# Patient Record
Sex: Female | Born: 1935 | Race: Black or African American | Hispanic: No | Smoking: Never smoker
Health system: Southern US, Community
[De-identification: ages and names within clinical notes are randomized; demographics above are authoritative.]

## PROBLEM LIST (undated history)

## (undated) DIAGNOSIS — M545 Low back pain, unspecified: Secondary | ICD-10-CM

## (undated) DIAGNOSIS — D649 Anemia, unspecified: Secondary | ICD-10-CM

## (undated) DIAGNOSIS — K5900 Constipation, unspecified: Secondary | ICD-10-CM

## (undated) DIAGNOSIS — K2100 Gastro-esophageal reflux disease with esophagitis, without bleeding: Secondary | ICD-10-CM

## (undated) DIAGNOSIS — K5730 Diverticulosis of large intestine without perforation or abscess without bleeding: Secondary | ICD-10-CM

## (undated) DIAGNOSIS — R634 Abnormal weight loss: Secondary | ICD-10-CM

## (undated) DIAGNOSIS — F0280 Dementia in other diseases classified elsewhere without behavioral disturbance: Secondary | ICD-10-CM

## (undated) DIAGNOSIS — K922 Gastrointestinal hemorrhage, unspecified: Secondary | ICD-10-CM

## (undated) DIAGNOSIS — M810 Age-related osteoporosis without current pathological fracture: Secondary | ICD-10-CM

## (undated) DIAGNOSIS — I10 Essential (primary) hypertension: Secondary | ICD-10-CM

## (undated) DIAGNOSIS — M1990 Unspecified osteoarthritis, unspecified site: Secondary | ICD-10-CM

## (undated) DIAGNOSIS — F419 Anxiety disorder, unspecified: Secondary | ICD-10-CM

## (undated) DIAGNOSIS — G309 Alzheimer's disease, unspecified: Secondary | ICD-10-CM

## (undated) HISTORY — DX: Abnormal weight loss: R63.4

## (undated) HISTORY — DX: Dementia in other diseases classified elsewhere, unspecified severity, without behavioral disturbance, psychotic disturbance, mood disturbance, and anxiety: F02.80

## (undated) HISTORY — DX: Anemia, unspecified: D64.9

## (undated) HISTORY — DX: Gastrointestinal hemorrhage, unspecified: K92.2

## (undated) HISTORY — DX: Diverticulosis of large intestine without perforation or abscess without bleeding: K57.30

## (undated) HISTORY — DX: Unspecified osteoarthritis, unspecified site: M19.90

## (undated) HISTORY — DX: Low back pain, unspecified: M54.50

## (undated) HISTORY — DX: Essential (primary) hypertension: I10

## (undated) HISTORY — DX: Anxiety disorder, unspecified: F41.9

## (undated) HISTORY — DX: Low back pain: M54.5

## (undated) HISTORY — DX: Alzheimer's disease, unspecified: G30.9

## (undated) HISTORY — DX: Age-related osteoporosis without current pathological fracture: M81.0

## (undated) HISTORY — DX: Constipation, unspecified: K59.00

## (undated) HISTORY — DX: Gastro-esophageal reflux disease with esophagitis: K21.0

## (undated) HISTORY — DX: Gastro-esophageal reflux disease with esophagitis, without bleeding: K21.00

---

## 2004-05-07 ENCOUNTER — Encounter: Admission: RE | Admit: 2004-05-07 | Discharge: 2004-05-07 | Payer: Self-pay | Admitting: Orthopedic Surgery

## 2005-06-28 ENCOUNTER — Inpatient Hospital Stay (HOSPITAL_COMMUNITY): Admission: EM | Admit: 2005-06-28 | Discharge: 2005-06-30 | Payer: Self-pay | Admitting: Emergency Medicine

## 2005-07-21 ENCOUNTER — Inpatient Hospital Stay (HOSPITAL_COMMUNITY): Admission: AD | Admit: 2005-07-21 | Discharge: 2005-07-29 | Payer: Self-pay | Admitting: Internal Medicine

## 2005-07-21 ENCOUNTER — Encounter: Payer: Self-pay | Admitting: Emergency Medicine

## 2005-07-27 ENCOUNTER — Encounter (INDEPENDENT_AMBULATORY_CARE_PROVIDER_SITE_OTHER): Payer: Self-pay | Admitting: Specialist

## 2006-01-26 ENCOUNTER — Ambulatory Visit: Payer: Self-pay | Admitting: Family Medicine

## 2006-03-02 ENCOUNTER — Ambulatory Visit: Payer: Self-pay | Admitting: Family Medicine

## 2006-03-07 ENCOUNTER — Emergency Department (HOSPITAL_COMMUNITY): Admission: EM | Admit: 2006-03-07 | Discharge: 2006-03-07 | Payer: Self-pay | Admitting: Emergency Medicine

## 2006-04-07 ENCOUNTER — Emergency Department (HOSPITAL_COMMUNITY): Admission: EM | Admit: 2006-04-07 | Discharge: 2006-04-07 | Payer: Self-pay | Admitting: Emergency Medicine

## 2006-04-10 ENCOUNTER — Ambulatory Visit: Payer: Self-pay | Admitting: Family Medicine

## 2006-04-13 ENCOUNTER — Ambulatory Visit: Payer: Self-pay | Admitting: Family Medicine

## 2006-05-12 ENCOUNTER — Emergency Department (HOSPITAL_COMMUNITY): Admission: EM | Admit: 2006-05-12 | Discharge: 2006-05-12 | Payer: Self-pay | Admitting: Emergency Medicine

## 2006-05-29 ENCOUNTER — Emergency Department (HOSPITAL_COMMUNITY): Admission: EM | Admit: 2006-05-29 | Discharge: 2006-05-29 | Payer: Self-pay | Admitting: Neonatology

## 2006-10-09 ENCOUNTER — Emergency Department (HOSPITAL_COMMUNITY): Admission: EM | Admit: 2006-10-09 | Discharge: 2006-10-09 | Payer: Self-pay | Admitting: Emergency Medicine

## 2008-01-08 ENCOUNTER — Ambulatory Visit (HOSPITAL_COMMUNITY): Admission: RE | Admit: 2008-01-08 | Discharge: 2008-01-08 | Payer: Self-pay | Admitting: Geriatric Medicine

## 2008-06-11 ENCOUNTER — Emergency Department (HOSPITAL_COMMUNITY): Admission: EM | Admit: 2008-06-11 | Discharge: 2008-06-11 | Payer: Self-pay | Admitting: Emergency Medicine

## 2008-12-02 ENCOUNTER — Ambulatory Visit: Payer: Self-pay | Admitting: Diagnostic Radiology

## 2008-12-02 ENCOUNTER — Emergency Department (HOSPITAL_BASED_OUTPATIENT_CLINIC_OR_DEPARTMENT_OTHER): Admission: EM | Admit: 2008-12-02 | Discharge: 2008-12-02 | Payer: Self-pay | Admitting: Emergency Medicine

## 2010-01-07 ENCOUNTER — Emergency Department (HOSPITAL_COMMUNITY): Admission: EM | Admit: 2010-01-07 | Discharge: 2010-01-07 | Payer: Self-pay | Admitting: Emergency Medicine

## 2010-12-12 ENCOUNTER — Encounter: Payer: Self-pay | Admitting: Orthopedic Surgery

## 2010-12-13 ENCOUNTER — Encounter: Payer: Self-pay | Admitting: Orthopedic Surgery

## 2010-12-19 ENCOUNTER — Inpatient Hospital Stay (HOSPITAL_COMMUNITY)
Admission: EM | Admit: 2010-12-19 | Discharge: 2010-12-29 | DRG: 378 | Disposition: A | Discharge status: Skilled Nursing Facility | Payer: Medicare Other | Attending: Internal Medicine | Admitting: Internal Medicine

## 2010-12-19 DIAGNOSIS — I472 Ventricular tachycardia, unspecified: Secondary | ICD-10-CM | POA: Diagnosis not present

## 2010-12-19 DIAGNOSIS — G309 Alzheimer's disease, unspecified: Secondary | ICD-10-CM | POA: Diagnosis present

## 2010-12-19 DIAGNOSIS — Z7982 Long term (current) use of aspirin: Secondary | ICD-10-CM

## 2010-12-19 DIAGNOSIS — Z66 Do not resuscitate: Secondary | ICD-10-CM | POA: Diagnosis not present

## 2010-12-19 DIAGNOSIS — K219 Gastro-esophageal reflux disease without esophagitis: Secondary | ICD-10-CM | POA: Diagnosis present

## 2010-12-19 DIAGNOSIS — D509 Iron deficiency anemia, unspecified: Secondary | ICD-10-CM | POA: Diagnosis present

## 2010-12-19 DIAGNOSIS — D62 Acute posthemorrhagic anemia: Secondary | ICD-10-CM | POA: Diagnosis present

## 2010-12-19 DIAGNOSIS — I4729 Other ventricular tachycardia: Secondary | ICD-10-CM | POA: Diagnosis not present

## 2010-12-19 DIAGNOSIS — K5731 Diverticulosis of large intestine without perforation or abscess with bleeding: Principal | ICD-10-CM | POA: Diagnosis present

## 2010-12-19 DIAGNOSIS — K5900 Constipation, unspecified: Secondary | ICD-10-CM | POA: Diagnosis present

## 2010-12-19 DIAGNOSIS — M810 Age-related osteoporosis without current pathological fracture: Secondary | ICD-10-CM | POA: Diagnosis present

## 2010-12-19 DIAGNOSIS — IMO0002 Reserved for concepts with insufficient information to code with codable children: Secondary | ICD-10-CM | POA: Diagnosis not present

## 2010-12-19 DIAGNOSIS — F0280 Dementia in other diseases classified elsewhere without behavioral disturbance: Secondary | ICD-10-CM | POA: Diagnosis present

## 2010-12-19 DIAGNOSIS — R55 Syncope and collapse: Secondary | ICD-10-CM | POA: Diagnosis not present

## 2010-12-19 DIAGNOSIS — F0390 Unspecified dementia without behavioral disturbance: Secondary | ICD-10-CM | POA: Diagnosis present

## 2010-12-19 DIAGNOSIS — Z79899 Other long term (current) drug therapy: Secondary | ICD-10-CM

## 2010-12-19 DIAGNOSIS — I10 Essential (primary) hypertension: Secondary | ICD-10-CM | POA: Diagnosis present

## 2010-12-19 DIAGNOSIS — C189 Malignant neoplasm of colon, unspecified: Secondary | ICD-10-CM | POA: Diagnosis present

## 2010-12-19 DIAGNOSIS — F411 Generalized anxiety disorder: Secondary | ICD-10-CM | POA: Diagnosis present

## 2010-12-19 DIAGNOSIS — F05 Delirium due to known physiological condition: Secondary | ICD-10-CM | POA: Diagnosis present

## 2010-12-19 LAB — POCT CARDIAC MARKERS
CKMB, poc: <1 ng/mL — ABNORMAL LOW (ref 1.0–8.0)
Myoglobin, poc: 66.3 ng/mL (ref 12–200)
Troponin i, poc: <0.05 ng/mL (ref 0.00–0.09)

## 2010-12-19 LAB — DIFFERENTIAL
Basophils Relative: 0 % (ref 0–1)
Eosinophils Absolute: 0 10*3/uL (ref 0.0–0.7)
Eosinophils Relative: 0 % (ref 0–5)
Lymphocytes Relative: 11 % — ABNORMAL LOW (ref 12–46)
Neutro Abs: 8.5 10*3/uL — ABNORMAL HIGH (ref 1.7–7.7)

## 2010-12-19 LAB — BASIC METABOLIC PANEL
BUN: 19 mg/dL (ref 6–23)
Calcium: 9.3 mg/dL (ref 8.4–10.5)
Creatinine, Ser: 0.81 mg/dL (ref 0.4–1.2)
GFR calc Af Amer: >60 mL/min (ref >60)
GFR calc non Af Amer: >60 mL/min (ref >60)

## 2010-12-19 LAB — CBC
HCT: 33.9 % — ABNORMAL LOW (ref 36.0–46.0)
MCH: 31.4 pg (ref 26.0–34.0)
MCV: 93.4 fL (ref 78.0–100.0)
Platelets: 215 10*3/uL (ref 150–400)
RBC: 3.63 MIL/uL — ABNORMAL LOW (ref 3.87–5.11)

## 2010-12-19 LAB — URINALYSIS, ROUTINE W REFLEX MICROSCOPIC
Bilirubin Urine: NEGATIVE
Nitrite: NEGATIVE
Protein, ur: NEGATIVE mg/dL
Specific Gravity, Urine: 1.022 (ref 1.005–1.030)
Urobilinogen, UA: 0.2 mg/dL (ref 0.0–1.0)

## 2010-12-19 LAB — HEMOGLOBIN AND HEMATOCRIT, BLOOD
HCT: 29.1 % — ABNORMAL LOW (ref 36.0–46.0)
Hemoglobin: 9.7 g/dL — ABNORMAL LOW (ref 12.0–15.0)

## 2010-12-19 LAB — OCCULT BLOOD, POC DEVICE: Fecal Occult Bld: POSITIVE

## 2010-12-19 LAB — URINE MICROSCOPIC-ADD ON

## 2010-12-20 LAB — GLUCOSE, CAPILLARY
Glucose-Capillary: 106 mg/dL — ABNORMAL HIGH (ref 70–99)
Glucose-Capillary: 128 mg/dL — ABNORMAL HIGH (ref 70–99)

## 2010-12-20 LAB — MRSA PCR SCREENING: MRSA by PCR: NEGATIVE

## 2010-12-20 LAB — HEMOGLOBIN AND HEMATOCRIT, BLOOD
HCT: 27.6 % — ABNORMAL LOW (ref 36.0–46.0)
Hemoglobin: 8.2 g/dL — ABNORMAL LOW (ref 12.0–15.0)

## 2010-12-21 LAB — GLUCOSE, CAPILLARY
Glucose-Capillary: 103 mg/dL — ABNORMAL HIGH (ref 70–99)
Glucose-Capillary: 118 mg/dL — ABNORMAL HIGH (ref 70–99)

## 2010-12-21 LAB — HEMOGLOBIN AND HEMATOCRIT, BLOOD
HCT: 24.2 % — ABNORMAL LOW (ref 36.0–46.0)
Hemoglobin: 8.3 g/dL — ABNORMAL LOW (ref 12.0–15.0)

## 2010-12-21 LAB — COMPREHENSIVE METABOLIC PANEL
ALT: 9 U/L (ref 0–35)
CO2: 27 mEq/L (ref 19–32)
Calcium: 8.4 mg/dL (ref 8.4–10.5)
Creatinine, Ser: 0.9 mg/dL (ref 0.4–1.2)
GFR calc non Af Amer: 59 mL/min — ABNORMAL LOW (ref >60)
Glucose, Bld: 111 mg/dL — ABNORMAL HIGH (ref 70–99)
Sodium: 142 mEq/L (ref 135–145)
Total Protein: 5.1 g/dL — ABNORMAL LOW (ref 6.0–8.3)

## 2010-12-21 LAB — CBC
Hemoglobin: 9.6 g/dL — ABNORMAL LOW (ref 12.0–15.0)
Platelets: 162 10*3/uL (ref 150–400)
RBC: 3.12 MIL/uL — ABNORMAL LOW (ref 3.87–5.11)
WBC: 11.1 10*3/uL — ABNORMAL HIGH (ref 4.0–10.5)

## 2010-12-22 HISTORY — PX: OTHER SURGICAL HISTORY: SHX169

## 2010-12-22 LAB — TYPE AND SCREEN
Antibody Screen: POSITIVE
Unit division: 0
Unit division: 0

## 2010-12-22 LAB — CBC
HCT: 30 % — ABNORMAL LOW (ref 36.0–46.0)
MCH: 31.7 pg (ref 26.0–34.0)
MCHC: 35 g/dL (ref 30.0–36.0)
MCV: 90.6 fL (ref 78.0–100.0)
Platelets: 136 10*3/uL — ABNORMAL LOW (ref 150–400)
RDW: 14.6 % (ref 11.5–15.5)
WBC: 10 10*3/uL (ref 4.0–10.5)

## 2010-12-22 LAB — BASIC METABOLIC PANEL
BUN: 9 mg/dL (ref 6–23)
Creatinine, Ser: 0.87 mg/dL (ref 0.4–1.2)
GFR calc non Af Amer: >60 mL/min (ref >60)
Glucose, Bld: 98 mg/dL (ref 70–99)
Potassium: 3.1 mEq/L — ABNORMAL LOW (ref 3.5–5.1)

## 2010-12-22 LAB — HEMOGLOBIN AND HEMATOCRIT, BLOOD
HCT: 27.2 % — ABNORMAL LOW (ref 36.0–46.0)
Hemoglobin: 10.3 g/dL — ABNORMAL LOW (ref 12.0–15.0)

## 2010-12-23 ENCOUNTER — Inpatient Hospital Stay (HOSPITAL_COMMUNITY): Payer: Medicare Other

## 2010-12-23 DIAGNOSIS — I471 Supraventricular tachycardia: Secondary | ICD-10-CM

## 2010-12-23 LAB — BASIC METABOLIC PANEL
BUN: 4 mg/dL — ABNORMAL LOW (ref 6–23)
Chloride: 109 mEq/L (ref 96–112)
Creatinine, Ser: 0.87 mg/dL (ref 0.4–1.2)
GFR calc non Af Amer: >60 mL/min (ref >60)
Glucose, Bld: 117 mg/dL — ABNORMAL HIGH (ref 70–99)
Potassium: 2.9 mEq/L — ABNORMAL LOW (ref 3.5–5.1)

## 2010-12-23 LAB — HEMOGLOBIN AND HEMATOCRIT, BLOOD
HCT: 28.3 % — ABNORMAL LOW (ref 36.0–46.0)
Hemoglobin: 9.5 g/dL — ABNORMAL LOW (ref 12.0–15.0)
Hemoglobin: 9.5 g/dL — ABNORMAL LOW (ref 12.0–15.0)
Hemoglobin: 9.5 g/dL — ABNORMAL LOW (ref 12.0–15.0)

## 2010-12-24 ENCOUNTER — Inpatient Hospital Stay (HOSPITAL_COMMUNITY): Admit: 2010-12-24 | Payer: Medicare Other

## 2010-12-24 ENCOUNTER — Inpatient Hospital Stay (HOSPITAL_COMMUNITY): Payer: Medicare Other

## 2010-12-24 LAB — BASIC METABOLIC PANEL
BUN: 5 mg/dL — ABNORMAL LOW (ref 6–23)
Chloride: 107 mEq/L (ref 96–112)
Glucose, Bld: 97 mg/dL (ref 70–99)
Potassium: 3.9 mEq/L (ref 3.5–5.1)

## 2010-12-24 LAB — CBC
HCT: 27.6 % — ABNORMAL LOW (ref 36.0–46.0)
MCH: 31.9 pg (ref 26.0–34.0)
MCV: 93.6 fL (ref 78.0–100.0)
RBC: 2.95 MIL/uL — ABNORMAL LOW (ref 3.87–5.11)
WBC: 9.8 10*3/uL (ref 4.0–10.5)

## 2010-12-24 MED ORDER — IOHEXOL 300 MG/ML  SOLN
450.0000 mL | Freq: Once | INTRAMUSCULAR | Status: AC | PRN
  Administered 2010-12-24: 450 mL
  Filled 2010-12-24: qty 450

## 2010-12-25 ENCOUNTER — Inpatient Hospital Stay (HOSPITAL_COMMUNITY): Payer: Medicare Other

## 2010-12-25 LAB — CBC
HCT: 29 % — ABNORMAL LOW (ref 36.0–46.0)
MCH: 31.2 pg (ref 26.0–34.0)
MCV: 94.2 fL (ref 78.0–100.0)
Platelets: 243 10*3/uL (ref 150–400)
RBC: 3.08 MIL/uL — ABNORMAL LOW (ref 3.87–5.11)

## 2010-12-25 LAB — BASIC METABOLIC PANEL
BUN: 5 mg/dL — ABNORMAL LOW (ref 6–23)
CO2: 29 mEq/L (ref 19–32)
Chloride: 107 mEq/L (ref 96–112)
Creatinine, Ser: 1.02 mg/dL (ref 0.4–1.2)
Glucose, Bld: 93 mg/dL (ref 70–99)

## 2010-12-25 LAB — HEMOGLOBIN AND HEMATOCRIT, BLOOD: Hemoglobin: 9.6 g/dL — ABNORMAL LOW (ref 12.0–15.0)

## 2010-12-25 MED ORDER — IOHEXOL 300 MG/ML  SOLN
125.0000 mL | Freq: Once | INTRAMUSCULAR | Status: AC | PRN
  Administered 2010-12-25: 125 mL via INTRAVENOUS

## 2010-12-26 LAB — BASIC METABOLIC PANEL
BUN: 5 mg/dL — ABNORMAL LOW (ref 6–23)
CO2: 28 mEq/L (ref 19–32)
Calcium: 9.2 mg/dL (ref 8.4–10.5)
Chloride: 106 mEq/L (ref 96–112)
Creatinine, Ser: 1.06 mg/dL (ref 0.4–1.2)
Glucose, Bld: 86 mg/dL (ref 70–99)

## 2010-12-26 LAB — CBC
HCT: 30.4 % — ABNORMAL LOW (ref 36.0–46.0)
Hemoglobin: 10.2 g/dL — ABNORMAL LOW (ref 12.0–15.0)
MCH: 31.5 pg (ref 26.0–34.0)
MCHC: 33.6 g/dL (ref 30.0–36.0)
MCV: 93.8 fL (ref 78.0–100.0)
RBC: 3.24 MIL/uL — ABNORMAL LOW (ref 3.87–5.11)

## 2010-12-28 LAB — BASIC METABOLIC PANEL
CO2: 29 mEq/L (ref 19–32)
Calcium: 9 mg/dL (ref 8.4–10.5)
GFR calc Af Amer: 59 mL/min — ABNORMAL LOW (ref >60)
GFR calc non Af Amer: 49 mL/min — ABNORMAL LOW (ref >60)
Glucose, Bld: 95 mg/dL (ref 70–99)
Potassium: 3.9 mEq/L (ref 3.5–5.1)
Sodium: 143 mEq/L (ref 135–145)

## 2011-01-01 ENCOUNTER — Emergency Department (HOSPITAL_COMMUNITY): Payer: Medicare Other

## 2011-01-01 ENCOUNTER — Emergency Department: Admit: 2011-01-01 | Payer: Medicare Other

## 2011-01-01 ENCOUNTER — Inpatient Hospital Stay (HOSPITAL_COMMUNITY)
Admission: EM | Admit: 2011-01-01 | Discharge: 2011-01-04 | DRG: 482 | Disposition: A | Discharge status: Skilled Nursing Facility | Payer: Medicare Other | Attending: Orthopedic Surgery | Admitting: Orthopedic Surgery

## 2011-01-01 DIAGNOSIS — G309 Alzheimer's disease, unspecified: Secondary | ICD-10-CM | POA: Diagnosis present

## 2011-01-01 DIAGNOSIS — K219 Gastro-esophageal reflux disease without esophagitis: Secondary | ICD-10-CM | POA: Diagnosis present

## 2011-01-01 DIAGNOSIS — E119 Type 2 diabetes mellitus without complications: Secondary | ICD-10-CM | POA: Diagnosis present

## 2011-01-01 DIAGNOSIS — S72143A Displaced intertrochanteric fracture of unspecified femur, initial encounter for closed fracture: Principal | ICD-10-CM | POA: Diagnosis present

## 2011-01-01 DIAGNOSIS — Z96659 Presence of unspecified artificial knee joint: Secondary | ICD-10-CM

## 2011-01-01 DIAGNOSIS — W19XXXA Unspecified fall, initial encounter: Secondary | ICD-10-CM | POA: Diagnosis present

## 2011-01-01 DIAGNOSIS — F0280 Dementia in other diseases classified elsewhere without behavioral disturbance: Secondary | ICD-10-CM | POA: Diagnosis present

## 2011-01-01 DIAGNOSIS — I10 Essential (primary) hypertension: Secondary | ICD-10-CM | POA: Diagnosis present

## 2011-01-01 HISTORY — PX: OTHER SURGICAL HISTORY: SHX169

## 2011-01-01 LAB — URINALYSIS, ROUTINE W REFLEX MICROSCOPIC
Ketones, ur: 15 mg/dL — AB
Leukocytes, UA: NEGATIVE
Protein, ur: 30 mg/dL — AB
Urine Glucose, Fasting: NEGATIVE mg/dL
pH: 6 (ref 5.0–8.0)

## 2011-01-01 LAB — CBC
HCT: 31.6 % — ABNORMAL LOW (ref 36.0–46.0)
Hemoglobin: 10.4 g/dL — ABNORMAL LOW (ref 12.0–15.0)
MCV: 95.5 fL (ref 78.0–100.0)
RDW: 15.5 % (ref 11.5–15.5)
WBC: 16.6 10*3/uL — ABNORMAL HIGH (ref 4.0–10.5)

## 2011-01-01 LAB — COMPREHENSIVE METABOLIC PANEL
ALT: 14 U/L (ref 0–35)
AST: 19 U/L (ref 0–37)
Albumin: 2.9 g/dL — ABNORMAL LOW (ref 3.5–5.2)
Alkaline Phosphatase: 52 U/L (ref 39–117)
Chloride: 106 mEq/L (ref 96–112)
Potassium: 3.6 mEq/L (ref 3.5–5.1)
Sodium: 143 mEq/L (ref 135–145)
Total Bilirubin: 0.7 mg/dL (ref 0.3–1.2)
Total Protein: 6.2 g/dL (ref 6.0–8.3)

## 2011-01-01 LAB — URINE MICROSCOPIC-ADD ON

## 2011-01-01 LAB — DIFFERENTIAL
Basophils Absolute: 0 10*3/uL (ref 0.0–0.1)
Eosinophils Relative: 0 % (ref 0–5)
Lymphocytes Relative: 9 % — ABNORMAL LOW (ref 12–46)
Lymphs Abs: 1.4 10*3/uL (ref 0.7–4.0)
Monocytes Absolute: 1 10*3/uL (ref 0.1–1.0)
Neutro Abs: 14.1 10*3/uL — ABNORMAL HIGH (ref 1.7–7.7)

## 2011-01-01 LAB — TYPE AND SCREEN: Antibody Screen: NEGATIVE

## 2011-01-02 LAB — BASIC METABOLIC PANEL
BUN: 11 mg/dL (ref 6–23)
Calcium: 8.3 mg/dL — ABNORMAL LOW (ref 8.4–10.5)
GFR calc non Af Amer: >60 mL/min (ref >60)
Glucose, Bld: 117 mg/dL — ABNORMAL HIGH (ref 70–99)
Potassium: 3.7 mEq/L (ref 3.5–5.1)
Sodium: 140 mEq/L (ref 135–145)

## 2011-01-02 LAB — CBC
HCT: 25.7 % — ABNORMAL LOW (ref 36.0–46.0)
MCHC: 33.1 g/dL (ref 30.0–36.0)
MCV: 95.2 fL (ref 78.0–100.0)
Platelets: 230 10*3/uL (ref 150–400)
RDW: 15.1 % (ref 11.5–15.5)
WBC: 11.3 10*3/uL — ABNORMAL HIGH (ref 4.0–10.5)

## 2011-01-02 LAB — GLUCOSE, CAPILLARY
Glucose-Capillary: 113 mg/dL — ABNORMAL HIGH (ref 70–99)
Glucose-Capillary: 119 mg/dL — ABNORMAL HIGH (ref 70–99)

## 2011-01-02 LAB — URINE CULTURE

## 2011-01-03 DIAGNOSIS — R404 Transient alteration of awareness: Secondary | ICD-10-CM

## 2011-01-03 LAB — GLUCOSE, CAPILLARY: Glucose-Capillary: 115 mg/dL — ABNORMAL HIGH (ref 70–99)

## 2011-01-08 ENCOUNTER — Emergency Department (HOSPITAL_COMMUNITY)
Admission: EM | Admit: 2011-01-08 | Discharge: 2011-01-08 | Disposition: A | Discharge status: Home / Self Care | Payer: Medicare Other | Attending: Emergency Medicine | Admitting: Emergency Medicine

## 2011-01-08 ENCOUNTER — Emergency Department (HOSPITAL_COMMUNITY): Payer: Medicare Other

## 2011-01-08 DIAGNOSIS — M25559 Pain in unspecified hip: Secondary | ICD-10-CM | POA: Insufficient documentation

## 2011-01-08 DIAGNOSIS — Z79899 Other long term (current) drug therapy: Secondary | ICD-10-CM | POA: Insufficient documentation

## 2011-01-08 DIAGNOSIS — G8918 Other acute postprocedural pain: Secondary | ICD-10-CM | POA: Insufficient documentation

## 2011-01-08 DIAGNOSIS — I10 Essential (primary) hypertension: Secondary | ICD-10-CM | POA: Insufficient documentation

## 2011-01-08 DIAGNOSIS — E119 Type 2 diabetes mellitus without complications: Secondary | ICD-10-CM | POA: Insufficient documentation

## 2011-01-08 DIAGNOSIS — K219 Gastro-esophageal reflux disease without esophagitis: Secondary | ICD-10-CM | POA: Insufficient documentation

## 2011-01-08 DIAGNOSIS — G309 Alzheimer's disease, unspecified: Secondary | ICD-10-CM | POA: Insufficient documentation

## 2011-01-08 DIAGNOSIS — F0280 Dementia in other diseases classified elsewhere without behavioral disturbance: Secondary | ICD-10-CM | POA: Insufficient documentation

## 2011-01-13 NOTE — H&P (Signed)
[name], [name]              ACCOUNT NO.:  [id number]  MEDICAL RECORD NO.:  [id number]          PATIENT TYPE:  INP  LOCATION:  0101                         FACILITY:  [facility]  PHYSICIAN:  Altha Harm, MDDATE OF BIRTH:  13-Dec-1918  DATE OF ADMISSION:  12/19/2010 DATE OF DISCHARGE:                             HISTORY & PHYSICAL   CHIEF COMPLAINT:  Bright red blood per rectum.  HISTORY OF PRESENT ILLNESS:  The patient is unable to give any history due to her dementia.  However, she can volunteer to me that she has some nausea with no vomiting.  According to the personnel from [facility], the patient was noted to have bright red blood per rectum and was sent over for evaluation.  They deny any history of fever, chills, vomiting, or diarrhea.  Here in the emergency, the patient was noted to have large bright red clots from her bottom and she has been afebrile here in the ER.  The patient is unable to give any more information because of her dementia and the nursing home was unable to supply any more details.  PAST MEDICAL HISTORY:  Significant for: 1. Dementia. 2. Hypertension. 3. Osteoporosis. 4. Constipation. 5. Iron deficiency anemia. 6. It looks like from the medications, gastroesophageal reflux disease     and possibly anxiety.  FAMILY HISTORY:  Again, I am unable to obtain because of her dementia.  SOCIAL HISTORY:  The patient lives in [facility] and it looks like she is in the assisted living.  However, I am unable to obtain any more social history.  Her current medications include the following: 1. Tylenol 650 mg p.o. daily. 2. Vasotec 2.5 mg p.o. daily. 3. Lorazepam 0.5 mg p.o. daily. 4. Fosamax 70 mg p.o. weekly. 5. Docusate 100 mg p.o. b.i.d. 6. MiraLax 17 g p.o. b.i.d. 7. Iron sulfate 325 mg p.o. daily. 8. Hydrocodone/APAP 5/325 mg p.o. b.i.d. 9. Aspirin enteric-coated 81 mg p.o. daily. 10.Exelon 1.5 mg p.o. b.i.d. 11.Prilosec 20 mg  p.o. q.h.s. 12.Calcium plus vitamin D 2 tablets p.o. b.i.d. 13.Absorbine liniment topically 1 application b.i.d. p.r.n. 14.Great shakes p.o. 1 bottle t.i.d..  ALLERGIES:  Per her records, her allergies are only to SULFA.  PRIMARY CARE PHYSICIAN:  Dr. Florentina Jenny.  REVIEW OF SYSTEMS:  The patient is unable to provide review of systems secondary to dementia.  The only thing she can tell me is that she has been having some nausea.  Please note that here in the emergency room, the patient was fully ambulatory by herself.  She attempted to get out of the bed and run back home.  Studies in the emergency room shows the following:  White blood cell count of 9.7, hemoglobin of 11.4, hematocrit of 33.9, platelet count of 215, and INR is 1.12.  Please note that her hemoglobin in February 2011 was 12.9.  Sodium 136, potassium 4.2, chloride 103, bicarbonate 27, BUN 19, and creatinine 0.81.  A 12-lead EKG shows normal sinus rhythm. Abdominal x-rays, which I will review once available.  In the emergency room, the urinalysis was negative for any elements consistent with urinary tract infection.  PHYSICAL EXAMINATION:  GENERAL:  The patient is lying in bed in no acute distress.  She is fully alert and awake.  She is oriented to herself only.  In general, this is a well-nourished patient, appears in no acute distress. VITAL SIGNS:  Her temperature is 98.6, heart rate 62, blood pressure 114/71, respiratory rate 12, and O2 saturations are 99% on room air. HEENT:  She is normocephalic and atraumatic.  Pupils are equal, round, and reactive to light.  The patient is unable to cooperate with the fundus examination.  However, there is no conjunctival pallor.  No icterus.  Oropharynx is moist.  Negative except erythema and lesions are noted.  The patient is completely edentulous and appears to have split over her mouth. NECK:  Trachea is midline.  No masses, no thyromegaly, no JVD, no carotid  bruits. RESPIRATORY:  She is a normal respiratory effort, equal excursion bilaterally.  No wheezing, no rhonchi noted. CARDIOVASCULAR:  She has got a normal S1 and S2.  No murmurs, rubs, or gallops are noted.  PMI is nondisplaced.  No heaves or thrills on palpation. ABDOMEN:  Soft.  She has got some right lower quadrant and suprapubic tenderness.  Otherwise, the abdomen is benign without any masses or any hepatosplenomegaly. LYMPH NODES:  She has no cervical, axillary, or inguinal lymphadenopathy noted. NEUROLOGICAL:  The patient is unable to cooperative with a formal neurological examination.  However, the patient demonstrates that she is able to move all extremities against gravity.  She is able to get out of the bed by herself and walk towards the door.  On inspection, she appears to have no asymmetry present.  DTRs are 2+ in the bilateral upper and lower extremities. PSYCHIATRIC:  As noted, the patient does have a long-standing dementia since 2006.  She is alert to herself.  However, her orientation to time and place is absent.  The patient does not have any recent recall, but she is able to tell me that she has 2 sons and the names of her sons, which were correct.  Certainly her insight and cognition are impaired.  ASSESSMENT AND PLAN:  This patient who presents with gastrointestinal bleed, which appears to be likely lowered, which is due to the bright red blood.  I suspect the patient may have some acute diverticulitis recurring or arteriovenous malformation.  I think that this is beyond the scope of hemorrhoids, given the amount of blood that was observed here in the emergency room.  Gastroenterology has been consulted with the patient and we will likely need to do an endoscopy.  We will monitor the patient's hemoglobin serially and replace her blood as necessary. The patient is being placed on clear liquids for now until she is seen by Gastroenterology.  All of her oral  medications will be held.  In terms of her hypertension present, the patient is normotensive and we will give her medications p.r.n. as needed for any escalations in her blood pressure.  The patient does have previous records of diabetes type 2 listed as part of history.  However, she has no medications that would address and no mention in records sent over this admission, diabetes type 2.  We will get a hemoglobin A1c to evaluate for the patient and check her blood sugars without coverage for now.  The patient is being admitted to non-telemetry bed as she is completely stable and we will check her vitals q.4 h.  For deep venous thrombosis prophylaxis, she will be on  SCDs and for gastrointestinal prophylaxis, Protonix will be added.     Altha Harm, MD     MAM/MEDQ  D:  12/19/2010  T:  12/19/2010  Job:  [id number]  cc:   Dr. Florentina Jenny.  Electronically Signed by Marthann Schiller MD on 01/13/2011 02:13:18 PM

## 2011-01-19 NOTE — Discharge Summary (Signed)
  [name], [name]              ACCOUNT NO.:  [id number]  MEDICAL RECORD NO.:  [id number]           PATIENT TYPE:  I  LOCATION:  5038                         FACILITY:  [facility]  PHYSICIAN:  Burnard Bunting, M.D.    DATE OF BIRTH:  03-28-1919  DATE OF ADMISSION:  01/01/2011 DATE OF DISCHARGE:  01/04/2011                              DISCHARGE SUMMARY   DISCHARGE DIAGNOSIS:  Left hip fracture.  SECONDARY DIAGNOSES: 1. Alzheimer's. 2. Diet-controlled diabetes. 3. History of diverticulosis status post GI bleed in January.  OPERATIONS AND PROCEDURES:  Left hip intertrochanteric fracture, open reduction internal fixation performed January 01, 2011.  HOSPITAL COURSE:  [name] is a 77 year old ambulatory female, resident of [facility] who sustained a fall on the day of admission.  She underwent operative fixation of the hip fracture.  Because of her recent GI bleed, she was maintained on aspirin and mechanical DVT prophylaxis.  She had an unremarkable hospital recovery.  Hemoglobin and laboratory values were stable at the time of discharge.  Her last hemoglobin was 8.5.  Vital signs were stable.  She is discharged in good condition.  Touchdown weightbearing left lower extremity.  Discharge medications include Depakote 250 b.i.d., Lopressor 25 mg p.o. b.i.d., ferrous sulfate 325 daily, lorazepam 0.5 mg tablets q.12 h., Fosamax 70 mg p.o. q.7 days, Colace 100 mg p.o. b.i.d., Exelon 1.5 mg p.o. b.i.d., Protonix 40 mg p.o. daily as well as Norco 5/325 one p.o. q.4 h. p.r.n. pain and aspirin 325 mg p.o. daily for 30 days.  She will follow up with me in 10 days for suture removal.  She will be touchdown weightbearing as tolerated, will likely advance her weightbearing status after her first office visit.  She is discharged in good condition.     Burnard Bunting, M.D.     GSD/MEDQ  D:  01/04/2011  T:  01/04/2011  Job:  [id number]  Electronically Signed by  Reece Agar.  DEAN M.D. on 01/19/2011 08:39:19 AM

## 2011-01-19 NOTE — H&P (Signed)
[name]              ACCOUNT NO.:  [id number]  MEDICAL RECORD NO.:  [id number]           PATIENT TYPE:  I  LOCATION:  5038                         FACILITY:  [facility]  PHYSICIAN:  Burnard Bunting, M.D.    DATE OF BIRTH:  1918-12-18  DATE OF ADMISSION:  01/01/2011 DATE OF DISCHARGE:                             HISTORY & PHYSICAL   CHIEF COMPLAINT:  Left hip pain.  HISTORY OF PRESENT ILLNESS:  [name] is an 77 year old ambulatory female, resident of [address] [facility], who fell earlier today without loss of consciousness.  She reports left hip pain.  She is unable to ambulate.  She denies any other orthopedic complaints, not really taking anything for the pain.  PAST MEDICAL HISTORY:  Notable for Alzheimer's; OA status post right TKA; diabetes, diet controlled; diverticulosis; status post recent hospitalization for GI bleed with subsequent sigmoidoscopy which demonstrated diverticulosis, she was discharged about 5 days ago; gastroesophageal reflux disease; hypertension.  The patient is a resident at [facility].  She ambulates unassisted there according to her son.  She does have a son in [place].  CURRENT MEDICATIONS:  Depakote 250 b.i.d., Lopressor 25 b.i.d., ferrous sulfate 325 daily, Lorazepam, Exelon, Fosamax.  ALLERGY STATUS:  SULFA.  Her old records from [facility] are reviewed.  The patient is a DNR.  PHYSICAL EXAMINATION:  GENERAL:  She is well developed, well nourished, in no acute distress, alert but not oriented. VITAL SIGNS:  Blood pressure 108/48, pulse 91, respirations 14. CHEST:  Clear to auscultation. CARDIAC:  Heart beats regular rate and rhythm. ABDOMEN:  Benign. EXTREMITIES:  Bilateral upper extremities have full range of motion of wrist, elbow, shoulder.  Palpable radial pulse bilaterally.  No real crepitus in the wrist, elbow, or shoulder region.  No swelling in these areas.  Right lower extremity has TKA scar and no pain,  grinding, or crepitus with ankle, hip, or knee range of motion.  There is no effusion in the right knee.  The DP pulse palpable bilaterally, 2+/4. Dorsiflexion and plantar flexion intact, 5+/5 bilaterally.  Left lower extremity is shortened.  There is no crepitus of the ankle range of motion and knee range of motion does have a lot of pain with hip range of motion.  LABORATORY VALUES:  Hemoglobin 10.9, white count 16, platelets 276. Sodium and potassium 143 and 3.6, BUN and creatinine 18 and 0.7 with glucose 109.  UA negative.  Chest x-ray, no airspace disease. EKG; normal sinus rhythm, right bundle-branch block. Left hip shows high intertrochanteric fracture.  IMPRESSION:  Left hip intertrochanteric fracture.  PLAN:  I called [name] who is the son of [name].  She actually is fairly active at [facility].  She is able to walk from her room to the meal location.  The risks and benefits of surgical and nonsurgical therapy are discussed with the patient and the patient's son.  At this time, because she is ambulatory, it is decided to proceed with fixation.  The risks and benefits, including but not limited to infection, nerve or vessel damage, failure of fixation, hospitalization, risk of deep  vein thrombosis, all discussed with the patient and the patient's son.  Likely that we may not be able to anticoagulate the patient because of a recent GI bleed.  We will likely go with aspirin and DVT and mechanical prophylaxis.  The risks and benefits are all discussed with [name] who agrees to proceed with surgery.  All questions were answered.     Burnard Bunting, M.D.     GSD/MEDQ  D:  01/01/2011  T:  01/01/2011  Job:  [id number]  Electronically Signed by Reece Agar.  DEAN M.D. on 01/19/2011 08:39:21 AM

## 2011-01-19 NOTE — Op Note (Signed)
  [name], [name]              ACCOUNT NO.:  [id number]  MEDICAL RECORD NO.:  [id number]           PATIENT TYPE:  I  LOCATION:  5038                         FACILITY:  [facility]  PHYSICIAN:  Burnard Bunting, M.D.    DATE OF BIRTH:  [birth year]  DATE OF PROCEDURE:  01/01/2011 DATE OF DISCHARGE:                              OPERATIVE REPORT   PREOPERATIVE DIAGNOSIS:  Left hip intertrochanteric fracture.  POSTOPERATIVE DIAGNOSIS:  Left hip intertrochanteric fracture.  PROCEDURE:  Left hip intertrochanteric fracture, open reduction and internal fixation with Smith and Nephew 10 x 38 intramedullary hip screw and a 90-mm lag screw.  SURGEON:  Burnard Bunting, MD.  ASSISTANT:  None.  ANESTHESIA:  General endotracheal.  ESTIMATED BLOOD LOSS:  50.  INDICATIONS:  [name] is an ambulatory 77 year old female with left hip fracture, who presents for operative management after explanation of risks and benefits.  PROCEDURE IN DETAIL:  The patient was brought to the operating room where general endotracheal anesthesia was induced.  Preoperative antibiotics were administered.  The patient was placed on the fracture table with left hip under traction in internal rotation.  A time-out was called.  Good reduction was then achieved with traction, confirmed in the AP and lateral planes under fluoroscopy.  The hip region was then prepped with alcohol and Betadine, which allowed to air dry, prepped with DuraPrep solution and draped in a sterile manner.  An incision was made about a handbreadth proximal to the tip of the trochanter.  Guide pin was placed through the tip of the trochanter, confirmed in the AP and lateral planes to be in the center portion of the proximal femur. Proximal reaming was performed in accordance with preoperative templating.  A 10 x 38 nail was placed.  Lag screw was then placed in the center inferior portion of the femoral head and neck with good purchase obtained.   Compression screw was then placed as well with the fracture released.  The fluoroscopy was used to confirm correct location of the hardware.  The patient tolerated the procedure well.  Instruments were removed.  Both incisions were irrigated and closed using 0 Vicryl suture, 2-0 Vicryl suture, and skin staples.  Mepilex dressing was applied.  The patient tolerated the procedure well without immediate complications.     Burnard Bunting, M.D.     GSD/MEDQ  D:  01/01/2011  T:  01/02/2011  Job:  [id number]  Electronically Signed by Reece Agar.  [name] M.D. on 01/19/2011 08:39:24 AM

## 2011-01-27 NOTE — Consult Note (Signed)
  [name], MOM              ACCOUNT NO.:  [id number]  MEDICAL RECORD NO.:  [id number]          PATIENT TYPE:  INP  LOCATION:  1228                         FACILITY:  [facility]  PHYSICIAN:  Nacole Fluhr C. Madilyn Fireman, M.D.    DATE OF BIRTH:  [birth year]  DATE OF CONSULTATION:  12/20/2010 DATE OF DISCHARGE:                                CONSULTATION   REASON FOR CONSULTATION:  Gastrointestinal bleed.  HISTORY OF PRESENT ILLNESS:  The patient is an 77 year old black female who presented from the nursing home with episodes of acute onset of hematochezia without any apparent pain and no hemodynamic instability. Her hemoglobin was 11.7 on admission, but she has had several more bloody stools since admission and her hemoglobin this morning is 8.4. She is unable to give any history but is fairly alert and denies any pain.  She had a flexible sigmoidoscopy and barium enema in 2006 which showed significant diverticulosis and inability to pass the scope beyond 30 cm with a strictured area seen on the barium enema.  In February 2011 she had a CT scan which showed significant sigmoid diverticulosis with some wall thickening.  As far as I can tell, she has not had any previous GI bleeding and there was no direct suspicion of neoplasm on the studies in 2006.  PAST MEDICAL HISTORY:  Dementia, hypertension, osteoporosis.  MEDICATIONS:  Tylenol, Vasotec, lorazepam, Fosamax, Docusate, MiraLax, Prilosec, Exelon.  ALLERGIES:  SULFA.  PHYSICAL EXAMINATION:  GENERAL:  The patient tries to converse, does not respond directly to specific questions. ABDOMEN:  Somewhat scaphoid, soft with normoactive bowel sounds.  No hepatosplenomegaly, mass or guarding.  IMPRESSION:  Lower gastrointestinal bleed.  Suspect diverticular with preexisting diverticular stricture.  Cannot conclusively rule out neoplasm.  PLAN:  Supportive care.  Transfuse as necessary.  Probably should have a sigmoidoscopy at some point this  admission.  We will hold off for now and see if her bleeding stops on its own.  We will follow with you.          ______________________________ Everardo All. Madilyn Fireman, M.D.     JCH/MEDQ  D:  12/20/2010  T:  12/20/2010  Job:  [id number]  Electronically Signed by Dorena Cookey M.D. on 01/25/2011 07:07:35 PM

## 2011-01-27 NOTE — Op Note (Signed)
  [name], [name]              ACCOUNT NO.:  [id number]  MEDICAL RECORD NO.:  [id number]          PATIENT TYPE:  INP  LOCATION:  1228                         FACILITY:  [facility]  PHYSICIAN:  Lindsi Bayliss C. Madilyn [name], M.D.    DATE OF BIRTH:  31-Oct-1919  DATE OF PROCEDURE:  12/22/2010 DATE OF DISCHARGE:                              OPERATIVE REPORT   PROCEDURE:  Attempted sigmoidoscopy  INDICATIONS FOR PROCEDURE:  Lower GI bleeding.  Some thickening of sigmoid colon on previous studies.  DESCRIPTION OF PROCEDURE:  The patient was placed in the left lateral decubitus position and placed on the pulse monitor with continuous low- flow oxygen delivered by nasal cannula.  She was sedated with 50 mcg IV fentanyl and 2 mg IV Versed.  The Olympus video colonoscope was inserted into the rectum and advanced to about 30 cm.  There was a lot of blood in the rectal vault which was suctioned away and no active bleeding was seen, although the blood did appear fairly fresh.  The scope was advanced fairly easy to about 30 cm where there was a sharp turn and what appeared to be a circumferential thickening of normal appearing mucosa with the lumen somewhere in the middle but I could not locate or advance the scope through it after many attempts.  This similar description was described when Dr. Randa Evens attempted a colonoscopy 5 years ago and was unable to traverse the same area.  Followup barium enema was successful and apparently just showed some mild stricture.  At any rate, I did not see any visible neoplasm.  No active bleeding involved with this lesion although it appeared the blood was coming from above, although the exact course of the lumen could not be followed through this area.  The scope was gradually withdrawn after I could not advance the scope through it.  The patient appeared to not hold air very well at this level and passed lot of flatus through the rectum as I tried to insufflate to  visualized better.  I did not specifically see any diverticula or any other mucosal abnormalities or definite active bleeding down to the rectum, although as mentioned there was fairly fresh blood seen a most levels.  The scope was then withdrawn and the patient returned to the recovery room in stable condition.  She tolerated the procedure well.  There were no immediate complications.  IMPRESSION: 1. Fresh blood in the rectum and sigmoid. 2. Possible extrinsic compression or edema of mucosal folds narrowing     lumen, unable to advance beyond 30 cm.  No neoplasm.  No active     bleeding source seen.  PLAN:  Will pursue.          ______________________________ [name], M.D.     JCH/MEDQ  D:  12/22/2010  T:  12/22/2010  Job:  [id number]  Electronically Signed by Dorena Cookey M.D. on 01/25/2011 07:07:40 PM

## 2011-01-28 NOTE — Consult Note (Signed)
NAMESHENEA, GIACOBBE              ACCOUNT NO.:  [id number]  MEDICAL RECORD NO.:  [id number]           PATIENT TYPE:  LOCATION:                                 FACILITY:  PHYSICIAN:  Pricilla Riffle, MD, FACCDATE OF BIRTH:  1919-02-20  DATE OF CONSULTATION:  12/23/2010 DATE OF DISCHARGE:                                CONSULTATION   IDENTIFICATION:  The patient is an 77 year old who we are asked to see regarding tachycardia.  HISTORY OF PRESENT ILLNESS:  The patient has no prior cardiac history by report other than hypertension.  She was admitted on December 19, 2010, with bright red blood per rectum, felt initially diverticular now being evaluated for colonic obstruction.  She had a near syncopal spell on December 19, 2010, which was felt secondary to bleeding.  The patient today has had two episodes of SVT.  Both broke quickly after IV Lopressor was given, one occurred at 3:25 a.m. to 3:54 a.m., second occurred at 1647 to 1747.  The patient was hemodynamically stable.  She denies complaints though she denies chest pain, though she is not the best historian.  ALLERGIES:  SULFA.  MEDICATIONS:  Medications here IV Protonix 40 mg b.i.d., IV fluids at 75 mL/hour, Haldol p.r.n., 4 units of packed red cells, Ativan, Exelon 1.5 p.o. b.i.d., Lasix p.r.n., diltiazem drip discontinued, KCl p.r.n., Lopressor 25 mg p.o. b.i.d., Seroquel 25 mg b.i.d., magnesium sulfate, Lopressor 5 mg q.6 h. p.r.n. IV.  PAST MEDICAL HISTORY: 1. GI bleed 2. Dementia. 3. Hypertension. 4. Osteoporosis. 5. Anemia. 6. GE reflux.  SOCIAL HISTORY:  The patient lives in [place] [facility].  FAMILY HISTORY:  Not obtainable.  REVIEW OF SYSTEMS:  Not obtainable.  The patient confused.  The patient is DNR.  PHYSICAL EXAMINATION:  GENERAL:  On exan, the patient is an agitated 77- year-old, trying to get out of bed, does not want to be examined. VITAL SIGNS:  Blood pressure 92 to 137/36 to 64, pulses 80s to  140s (sinus rhythm/SVT), respiratory rate is 19.  Temperature max is 99.2, O2 sat on room air is 99%.  I and O's negative 232 today. HEENT:  Normocephalic and atraumatic.  EOMI.  PERRL. NECK:  No obvious JVD.  Did not listen further. LUNGS:  Clear to auscultation.  No rales or wheezes. CARDIAC:  Regular rate and rhythm.  S1 and S2.  No S3.  No murmurs. ABDOMEN:  The patient refused. EXTREMITIES:  No edema.  Otherwise, the patient refused further exam. MUSCULOSKELETAL:  Moving all extremities. NEUROLOGICAL:  The patient confused, did not test further.  LABORATORY DATA:  Chest x-ray no acute disease.  12-lead EKG December 23, 2010, at 3:40 a.m. SVT at a rate of 148 beats per minute, right bundle- branch block.  December 23, 2010 at 1402 sinus rhythm 83 beats per minute, right bundle-branch block.  December 23, 2010 at 1722 SVT at 131, right bundle-branch block.  Labs significant for hemoglobin of 9.5, BUN and creatinine of 4 and 0.87, potassium of 2.9, troponin less than 0.05, INR 1.1, and magnesium 2.  IMPRESSION:  The patient is an 77 year old with history  of gastrointestinal bleed.  Now, with 2 episodes of supraventricular tachycardia , rate is about 140 beats per minute, broke after IV Lopressor given.  She tolerated it fairly well hemodynamically.  RECOMMENDATIONS: 1. Given all the above medical issues, we would continue medical     treatment, would give IV Lopressor q.6 h 5 mg DC p.o. as absorption     may be erratic given GI issues. 2. Continue telemetry. 3. Replete electrolytes. 4. Transfuse as needed. 5. Hypertension.  Follow.  We will continue to follow with you.     Pricilla Riffle, MD, [facility]     PVR/MEDQ  D:  12/24/2010  T:  12/24/2010  Job:  [id number]  Electronically Signed by Dietrich Pates MD [facility] on 01/27/2011 02:13:23 PM

## 2011-02-09 LAB — HEPATIC FUNCTION PANEL
ALT: 9 U/L (ref 0–35)
AST: 14 U/L (ref 0–37)
Albumin: 2.5 g/dL — ABNORMAL LOW (ref 3.5–5.2)
Total Bilirubin: 0.4 mg/dL (ref 0.3–1.2)

## 2011-02-09 LAB — URINE CULTURE: Colony Count: 60000

## 2011-02-09 LAB — BASIC METABOLIC PANEL
CO2: 30 mEq/L (ref 19–32)
Calcium: 9.6 mg/dL (ref 8.4–10.5)
Creatinine, Ser: 0.81 mg/dL (ref 0.4–1.2)
GFR calc Af Amer: >60 mL/min (ref >60)
GFR calc non Af Amer: >60 mL/min (ref >60)
Sodium: 136 mEq/L (ref 135–145)

## 2011-02-09 LAB — URINALYSIS, ROUTINE W REFLEX MICROSCOPIC
Bilirubin Urine: NEGATIVE
Ketones, ur: NEGATIVE mg/dL
Nitrite: NEGATIVE
Protein, ur: NEGATIVE mg/dL
Urobilinogen, UA: 0.2 mg/dL (ref 0.0–1.0)

## 2011-02-09 LAB — HEMOCCULT GUIAC POC 1CARD (OFFICE): Fecal Occult Bld: NEGATIVE

## 2011-02-09 LAB — DIFFERENTIAL
Basophils Absolute: 0.1 10*3/uL (ref 0.0–0.1)
Basophils Relative: 1 % (ref 0–1)
Lymphocytes Relative: 15 % (ref 12–46)
Monocytes Relative: 5 % (ref 3–12)
Neutro Abs: 6.4 10*3/uL (ref 1.7–7.7)
Neutrophils Relative %: 79 % — ABNORMAL HIGH (ref 43–77)

## 2011-02-09 LAB — CBC
MCHC: 33.9 g/dL (ref 30.0–36.0)
RBC: 4.06 MIL/uL (ref 3.87–5.11)
WBC: 8.2 10*3/uL (ref 4.0–10.5)

## 2011-02-09 LAB — LACTIC ACID, PLASMA: Lactic Acid, Venous: 2 mmol/L (ref 0.5–2.2)

## 2011-04-08 NOTE — Discharge Summary (Signed)
NAMEYVONDA, FOUTY              ACCOUNT NO.:  [id number]   MEDICAL RECORD NO.:  [id number]          PATIENT TYPE:  INP   LOCATION:  1609                         FACILITY:  [facility]   PHYSICIAN:  Fleet Contras, M.D.    DATE OF BIRTH:  1919-06-23   DATE OF ADMISSION:  06/28/2005  DATE OF DISCHARGE:  06/30/2005                                 DISCHARGE SUMMARY   ADMITTING PHYSICIAN:  Fleet Contras, M.D.   HISTORY OF PRESENT ILLNESS:  Ms. Biederman is an 77 year old African-American  lady with a past medical history significant for type 2 diabetes mellitus,  gastroesophageal reflux disease, and Alzheimer's dementia. She is a resident  of [facility] skilled nursing facility. She was transferred to the  emergency room at [facility] because of complaints of abdominal  pain and persistent vomiting. She denies any chest pain, shortness of  breath, orthopnea or PND. She had apparently been constipated for a few  days. On evaluation in the emergency room, she was mildly dehydrated. An x-  ray of the abdomen did show ileus and two collections in the colon. She was  therefore admitted for laxative therapy, intravenous fluids and close  monitoring.   HOSPITAL COURSE:  On admission, the patient was placed on a nothing by mouth  regimen. She received intravenous fluids as well as laxatives and enemas.  She had good results with these. She had no further episodes of nausea,  vomiting, or abdominal pain while in the hospital. She was started on a soft  diet and this was advanced to regular diet which she has been able to  tolerate. Her vital signs have been stable throughout hospitalization.  Initial laboratory data shows white count of 7.2, hemoglobin 12.8,  hematocrit 38.3, platelet count of 312. Chemistry shows sodium of 140,  potassium 4.2, chloride 104, bicarbonate of 31, BUN 6, creatinine 1.0 and  glucose of 83. Her liver function test was essentially normal except for  albumin of  2.9. Urine culture, cardiac enzymes essentially negative. A  repeat abdominal x-ray on June 29, 2005 did not show any evidence of ileus.  Today she is comfortable, not in acute respiratory or painful distress. Her  blood pressure is 135/66, heart rate 70, respiratory rate of 20, temperature  of 98.8. O2 saturation is 95% on room air. She is therefore considered  stable for discharge to skilled nursing home today.   ASSISTANT:  Ms. Magin Balbi is an 77 year old African-American lady with  past medical history of type 2 diabetes mellitus, Alzheimer's dementia  admitted with constipation, fecal impaction and ileus. She has responded  well to conservative therapy and she is considered stable for discharge back  to the nursing home today.   ADMISSION DIAGNOSES:  1.  Constipation.  2.  Fecal impaction.  3.  Dehydration.   DISCHARGE MEDICATIONS:  1.  Amaryl 2 mg daily.  2.  Prilosec 20 mg daily.  3.  Exelon 1.5 mg p.o. b.i.d.  4.  Senokot 2 tablets p.o. daily.  5.  Darvocet-N 100, 1 p.o. q.6 h p.r.n.   CONDITION ON DISCHARGE:  Stable.  DISPOSITION:  [facility] skilled nursing facility.   FOLLOW UP:  Followup will be by me in two weeks.      Fleet Contras, M.D.  Electronically Signed     EA/MEDQ  D:  06/30/2005  T:  06/30/2005  Job:  [id number]

## 2011-04-08 NOTE — Discharge Summary (Signed)
[name], [name]              ACCOUNT NO.:  [id number]   MEDICAL RECORD NO.:  [id number]          PATIENT TYPE:  INP   LOCATION:  5148                         FACILITY:  [facility]   PHYSICIAN:  Fleet Contras, M.D.    DATE OF BIRTH:  Apr 06, 1919   DATE OF ADMISSION:  07/21/2005  DATE OF DISCHARGE:  07/28/2005                                 DISCHARGE SUMMARY   PRESENTATION:  [name] is an 77 year old African-American lady  with past medical history significant for type 2 diabetes mellitus,  hypertension, and chronic constipation.  She presented to the emergency room  of [facility] with complaints of onset of abdominal pain  that morning.  She apparently had been constipated for a while and had not  had a good bowel movement.  On evaluation in the emergency room, the patient  was noted to be tachycardic on the monitor with heart rate in the 130s to  140s.  Abdominal x-ray did not reveal any acute change in her bowel gas  pattern.  The patient was admitted for further monitoring and especially to  rule out any acute colonic lesions.   HOSPITAL COURSE:  On admission, the patient was placed on telemetry bed.  However, there were no beds available at [facility] and  she was therefore transferred to [facility].  She was placed on  clear liquid diet, intravenous fluids, and close monitoring.  Her abdominal  pain subsided.  She did not have any vomiting or diarrhea.  She required  some laxative therapy to enhance bowel movement.  A CT scan of the abdomen  was performed and this showed diffuse chronic diverticuli without  diverticulitis.  There was no evidence of any ischemic bowel lesions.  A  gastroenterology consult was requested.  The patient was seen by Heather Fearing L.  [name], M.D. who initially recommended conservative therapy with laxatives  and a barium enema.  Barium enema revealed an area of narrowing in the  sigmoid colon  suggestive of diverticular disease.  She therefore underwent a  flexible sigmoidoscopy which again revealed areas of structuring on the  sigmoid colon.  Biopsies of this region have now returned showing no  evidence of malignancy.  During the course of hospitalization while she was  on telemetry, she did not have any episodes of tachycardia.  She has been  tolerating full oral diet without any vomiting, diarrhea, or abdominal pain.  Her fingerstick glucose has been monitoring and well controlled.  Her blood  pressure has been stable.  Her latest laboratory data showed CBC of  July 26, 2005; white count 8.7, hemoglobin 13.1, hematocrit 39.4,  platelet count 294.  Her amylase was normal.  Today she is not in any acute  respiratory or painful distress.  She has no abdominal pain.  She is  tolerating a full oral diet and she has had bowel movements.  Her vital  signs are stable.  She is well-hydrated.  She is not pale.  She is not  icteric.  Chest shows good air entry bilaterally with no rales or rhonchi.  Abdomen is soft, nontender, and no masses.  Bowel sounds are present.  Extremities show no edema or ulcerations.  CNS; she is alert but demented.  She has no focal neurological deficits.   ASSESSMENT:  [name] is an 77 year old African-American lady with past  medical history significant for type 2 diabetes mellitus, hypertension, and  Alzheimer's dementia.  She presented to the hospital with abdominal pain and  was found to be tachycardic in the emergency room.  CT scan, barium enema,  and flexible sigmoidoscopy have revealed colonic diverticulosis without  diverticulitis.  She has been started on oral Flagyl and Cipro for mild  diverticulitis.  She has remained afebrile with no leukocytosis.  She is now  considered stable for discharge back to the nursing home.   DISCHARGE MEDICATIONS:  1.  Amaryl 2 mg once a day.  2.  Benicar 40 mg once a day.  3.  Ciprofloxacin 500 mg b.i.d.   4.  Flagyl 500 mg b.i.d.  5.  She will continue the antibiotics for another 5 days.  6.  MiraLax 17 grams in water or orange juice b.i.d.  7.  Protonix 40 mg once a day.  8.  Darvocet-N 100 one to two tablets q.6 hours p.r.n.   This plan of care has been discussed with her and her son who is her head  power of attorney, [name] and all of their questions have been  answered.  She will be followed up by the house physician at the nursing  home which is [facility] and she will be seen back in my office in 2  to 4 weeks.      Fleet Contras, M.D.  Electronically Signed     EA/MEDQ  D:  07/28/2005  T:  07/29/2005  Job:  [id number]

## 2011-04-08 NOTE — Consult Note (Signed)
[name], [name]              ACCOUNT NO.:  [id number]   MEDICAL RECORD NO.:  [id number]          PATIENT TYPE:  INP   LOCATION:  3713                         FACILITY:  [facility]   PHYSICIAN:  James L. Malon Kindle., M.D.DATE OF BIRTH:  [birth year]   DATE OF CONSULTATION:  07/22/2005  DATE OF DISCHARGE:                                   CONSULTATION   REFERRING PHYSICIAN:  Fleet Contras, M.D.   REASON FOR CONSULTATION:  Abdominal pain.   HISTORY:  An 77 year old woman who was admitted for abdominal pain.  She was  seen in the emergency room and moved to [facility].  She was having some  paroxysmal tachycardia that resolved and was on telemetry.  The abdominal  pain is being watched and seems to be improving.  According to the records,  the patient was just in the hospital two or three weeks ago.  She is a  resident of [facility], and she was  hospitalized with constipation and fecal impaction.  She was sent back home  on Senokot.  The patient has dementia, and her history is somewhat suspect.  She indicates that she has had past diverticulitis and has had left-sided  abdominal pain for some time.  On this admission, her labs were remarkable  for a normal white count, hemoglobin.  Liver function tests were also  normal.  Her albumin is slightly low at 3.0.  Cardiac enzymes were negative.  She is on telemetry and is being monitored.  Urinalysis is normal.  CT scan  of the abdomen and pelvis were obtained showing significant diverticular  disease but no active diverticulitis, diffuse fatty liver, slightly  prominent pancreatic bile duct but again normal liver tests.  The patient  says her pain is a bit better now.  She has had the same pain for some time.  Sometimes it gets worse and sometimes it doesn't.  She is unable to relate  it to eating or any other particular events.  She denies relation to bowel  movements.   MEDICATIONS ON ADMISSION:  Benicar,  Prilosec, Exelon, Anaprox, Darvocet,  Senokot.   HER RECORDS INDICATE THAT SHE HAS NO DRUG ALLERGIES.   MEDICAL HISTORY:  Type-2 diabetes, mild Alzheimer's dementia, history of  reflux, history of previous diverticulitis and hypertension.  She indicates  to me that the only surgery that she has ever had was a tubal ligation.  She  does not remember when this was but some time in the distant past.   FAMILY HISTORY:  Not obtainable.   SOCIAL HISTORY:  She apparently has a son, [name], phone number [phone], whom is her power of attorney.   PHYSICAL EXAMINATION:  The patient is afebrile.  Blood pressure 120/76,  pulse 80.  GENERAL:  A pleasant African-American female sitting up in a hospital bed  eating a regular diet, no obvious distress.  EYES:  Clear, nonicteric.  LUNGS:  Clear.  HEART:  Regular rate and rhythm without murmurs or gallops.  ABDOMEN:  Soft, nondistended, with no tenderness that I can elicit.  RECTAL:  The  stool is hard but not impacted, brown and heme negative.   ASSESSMENT:  Left-sided abdominal pain that comes and goes.  This may be  constipation related.  She does have diverticulosis but no active  diverticulitis on the CT scan.   PLAN:  Will go ahead and try to get the patient's bowels moving regularly  with MiraLax, which she may need to be on regularly.  Will go ahead and  obtain a barium enema to look at her colon.  I think I would not do a  colonoscopy on this lady until something shows up on barium enema.  The  cause of her pain likely is due to constipation.           ______________________________  Llana Aliment Malon Kindle., M.D.     Waldron Session  D:  07/22/2005  T:  07/22/2005  Job:  [id number]   cc:   Fleet Contras, M.D.  [address]  [place] [address]  Fax: [phone]  Email: [email or web address]

## 2011-04-08 NOTE — Op Note (Signed)
[name], [name]              ACCOUNT NO.:  [id number]   MEDICAL RECORD NO.:  [id number]          PATIENT TYPE:  INP   LOCATION:  3713                         FACILITY:  [facility]   PHYSICIAN:  James L. Malon Kindle., M.D.DATE OF BIRTH:  12/30/1918   DATE OF PROCEDURE:  DATE OF DISCHARGE:                                 OPERATIVE REPORT   PROCEDURE:  Sigmoidoscopy with biopsy.   ENDOSCOPE:  Pediatric adjustable colonoscope.   INDICATIONS:  The patient came in with constipation, left-sided abdominal  pain.  CT scan showed a lot of diverticula with a narrowed area.  Barium  enema showed soft tissue changes.  It could not entirely rule out a  malignancy.  It had the appearance more of diverticulitis.  The patient is  on antibiotics.  This is done to evaluate the area for malignancy.   DESCRIPTION OF PROCEDURE:  The procedure explained to the patient's son and  consent obtained.  Left lateral decubitus position.  The pediatric  adjustable scope inserted.  The patient had been prepped with tap water  enemas.  We were able to advance to 25-30 cm.  At this point there was a  markedly narrowed area with a lot of diverticula, some redness.  It had the  appearance more of diverticulitis than of a tumor.  I attempted to pass  through the narrowed area but it was too uncomfortable, and I could see the  solid stool on the other side.  It was clearly strictured here.  Multiple  biopsies were taken to rule out an occult malignancy.  The scope was  withdrawn.  The patient tolerated the procedure well.   ASSESSMENT:  Sigmoid colon stricture, probably due to diverticulitis with  diverticular stricture, cannot entirely rule out malignancy.   PLAN:  Will check pathology, continue on antibiotics and keep on chronic  Miralax.           ______________________________  Llana Aliment. Malon Kindle., M.D.     Waldron Session  D:  07/27/2005  T:  07/27/2005  Job:  [id number]   cc:   Fleet Contras, M.D.  [address]  [place] [address]  Fax: [phone]  Email: [email or web address]

## 2011-04-28 ENCOUNTER — Emergency Department (HOSPITAL_COMMUNITY)
Admission: EM | Admit: 2011-04-28 | Discharge: 2011-04-28 | Disposition: A | Discharge status: Home / Self Care | Payer: Medicare Other | Attending: Emergency Medicine | Admitting: Emergency Medicine

## 2011-04-28 DIAGNOSIS — I10 Essential (primary) hypertension: Secondary | ICD-10-CM | POA: Insufficient documentation

## 2011-04-28 DIAGNOSIS — Z79899 Other long term (current) drug therapy: Secondary | ICD-10-CM | POA: Insufficient documentation

## 2011-04-28 DIAGNOSIS — N949 Unspecified condition associated with female genital organs and menstrual cycle: Secondary | ICD-10-CM | POA: Insufficient documentation

## 2011-04-28 DIAGNOSIS — G309 Alzheimer's disease, unspecified: Secondary | ICD-10-CM | POA: Insufficient documentation

## 2011-04-28 DIAGNOSIS — N390 Urinary tract infection, site not specified: Secondary | ICD-10-CM | POA: Insufficient documentation

## 2011-04-28 DIAGNOSIS — R5381 Other malaise: Secondary | ICD-10-CM | POA: Insufficient documentation

## 2011-04-28 DIAGNOSIS — R079 Chest pain, unspecified: Secondary | ICD-10-CM | POA: Insufficient documentation

## 2011-04-28 DIAGNOSIS — R5383 Other fatigue: Secondary | ICD-10-CM | POA: Insufficient documentation

## 2011-04-28 DIAGNOSIS — K219 Gastro-esophageal reflux disease without esophagitis: Secondary | ICD-10-CM | POA: Insufficient documentation

## 2011-04-28 DIAGNOSIS — F0280 Dementia in other diseases classified elsewhere without behavioral disturbance: Secondary | ICD-10-CM | POA: Insufficient documentation

## 2011-04-28 LAB — CBC
HCT: 41.1 % (ref 36.0–46.0)
Hemoglobin: 14.3 g/dL (ref 12.0–15.0)
MCH: 29.5 pg (ref 26.0–34.0)
MCHC: 34.8 g/dL (ref 30.0–36.0)
MCV: 84.7 fL (ref 78.0–100.0)
Platelets: 257 K/uL (ref 150–400)
RBC: 4.85 MIL/uL (ref 3.87–5.11)
RDW: 18.8 % — ABNORMAL HIGH (ref 11.5–15.5)
WBC: 9.1 K/uL (ref 4.0–10.5)

## 2011-04-28 LAB — COMPREHENSIVE METABOLIC PANEL WITH GFR
ALT: 8 U/L (ref 0–35)
AST: 18 U/L (ref 0–37)
Albumin: 3.1 g/dL — ABNORMAL LOW (ref 3.5–5.2)
Alkaline Phosphatase: 65 U/L (ref 39–117)
BUN: 11 mg/dL (ref 6–23)
CO2: 32 meq/L (ref 19–32)
Calcium: 10.5 mg/dL (ref 8.4–10.5)
Chloride: 99 meq/L (ref 96–112)
Creatinine, Ser: 0.53 mg/dL (ref 0.4–1.2)
GFR calc non Af Amer: >60 mL/min
Glucose, Bld: 128 mg/dL — ABNORMAL HIGH (ref 70–99)
Potassium: 3 meq/L — ABNORMAL LOW (ref 3.5–5.1)
Sodium: 140 meq/L (ref 135–145)
Total Bilirubin: 0.5 mg/dL (ref 0.3–1.2)
Total Protein: 7.4 g/dL (ref 6.0–8.3)

## 2011-04-28 LAB — DIFFERENTIAL
Basophils Absolute: 0 K/uL (ref 0.0–0.1)
Basophils Relative: 0 % (ref 0–1)
Eosinophils Absolute: 0 K/uL (ref 0.0–0.7)
Eosinophils Relative: 0 % (ref 0–5)
Lymphocytes Relative: 7 % — ABNORMAL LOW (ref 12–46)
Lymphs Abs: 0.6 K/uL — ABNORMAL LOW (ref 0.7–4.0)
Monocytes Absolute: 0.1 K/uL (ref 0.1–1.0)
Monocytes Relative: 1 % — ABNORMAL LOW (ref 3–12)
Neutro Abs: 8.3 K/uL — ABNORMAL HIGH (ref 1.7–7.7)
Neutrophils Relative %: 92 % — ABNORMAL HIGH (ref 43–77)

## 2011-04-28 LAB — URINALYSIS, ROUTINE W REFLEX MICROSCOPIC
Bilirubin Urine: NEGATIVE
Ketones, ur: 40 mg/dL — AB
Nitrite: POSITIVE — AB
Urobilinogen, UA: 1 mg/dL (ref 0.0–1.0)
pH: 7 (ref 5.0–8.0)

## 2011-04-28 LAB — URINE MICROSCOPIC-ADD ON

## 2011-04-28 LAB — OCCULT BLOOD, POC DEVICE: Fecal Occult Bld: NEGATIVE

## 2011-04-30 LAB — URINE CULTURE
Colony Count: >=100000
Culture  Setup Time: 201206071823

## 2011-05-11 ENCOUNTER — Emergency Department (HOSPITAL_COMMUNITY): Payer: Medicare Other

## 2011-05-11 ENCOUNTER — Emergency Department (HOSPITAL_COMMUNITY)
Admission: EM | Admit: 2011-05-11 | Discharge: 2011-05-12 | Disposition: A | Discharge status: Home / Self Care | Payer: Medicare Other | Attending: Emergency Medicine | Admitting: Emergency Medicine

## 2011-05-11 DIAGNOSIS — G309 Alzheimer's disease, unspecified: Secondary | ICD-10-CM | POA: Insufficient documentation

## 2011-05-11 DIAGNOSIS — E119 Type 2 diabetes mellitus without complications: Secondary | ICD-10-CM | POA: Insufficient documentation

## 2011-05-11 DIAGNOSIS — Z66 Do not resuscitate: Secondary | ICD-10-CM | POA: Insufficient documentation

## 2011-05-11 DIAGNOSIS — I10 Essential (primary) hypertension: Secondary | ICD-10-CM | POA: Insufficient documentation

## 2011-05-11 DIAGNOSIS — Z981 Arthrodesis status: Secondary | ICD-10-CM | POA: Insufficient documentation

## 2011-05-11 DIAGNOSIS — F0280 Dementia in other diseases classified elsewhere without behavioral disturbance: Secondary | ICD-10-CM | POA: Insufficient documentation

## 2011-05-11 DIAGNOSIS — W19XXXA Unspecified fall, initial encounter: Secondary | ICD-10-CM | POA: Insufficient documentation

## 2011-05-11 DIAGNOSIS — Y9210 Unspecified residential institution as the place of occurrence of the external cause: Secondary | ICD-10-CM | POA: Insufficient documentation

## 2011-05-11 DIAGNOSIS — M25559 Pain in unspecified hip: Secondary | ICD-10-CM | POA: Insufficient documentation

## 2011-05-11 DIAGNOSIS — S79919A Unspecified injury of unspecified hip, initial encounter: Secondary | ICD-10-CM | POA: Insufficient documentation

## 2011-05-21 ENCOUNTER — Emergency Department (HOSPITAL_COMMUNITY)
Admission: EM | Admit: 2011-05-21 | Discharge: 2011-05-21 | Disposition: A | Discharge status: Home / Self Care | Payer: Medicare Other | Attending: Emergency Medicine | Admitting: Emergency Medicine

## 2011-05-21 DIAGNOSIS — G309 Alzheimer's disease, unspecified: Secondary | ICD-10-CM | POA: Insufficient documentation

## 2011-05-21 DIAGNOSIS — F068 Other specified mental disorders due to known physiological condition: Secondary | ICD-10-CM | POA: Insufficient documentation

## 2011-05-21 DIAGNOSIS — F0280 Dementia in other diseases classified elsewhere without behavioral disturbance: Secondary | ICD-10-CM | POA: Insufficient documentation

## 2011-05-21 DIAGNOSIS — E119 Type 2 diabetes mellitus without complications: Secondary | ICD-10-CM | POA: Insufficient documentation

## 2011-05-21 DIAGNOSIS — I10 Essential (primary) hypertension: Secondary | ICD-10-CM | POA: Insufficient documentation

## 2011-05-21 DIAGNOSIS — M79609 Pain in unspecified limb: Secondary | ICD-10-CM | POA: Insufficient documentation

## 2011-05-21 DIAGNOSIS — K219 Gastro-esophageal reflux disease without esophagitis: Secondary | ICD-10-CM | POA: Insufficient documentation

## 2011-05-21 DIAGNOSIS — R079 Chest pain, unspecified: Secondary | ICD-10-CM | POA: Insufficient documentation

## 2011-05-21 DIAGNOSIS — Z79899 Other long term (current) drug therapy: Secondary | ICD-10-CM | POA: Insufficient documentation

## 2011-05-21 LAB — BASIC METABOLIC PANEL
CO2: 28 mEq/L (ref 19–32)
Calcium: 9.2 mg/dL (ref 8.4–10.5)
Creatinine, Ser: 0.64 mg/dL (ref 0.50–1.10)
Glucose, Bld: 81 mg/dL (ref 70–99)
Sodium: 140 mEq/L (ref 135–145)

## 2011-05-21 LAB — DIFFERENTIAL
Basophils Relative: 1 % (ref 0–1)
Eosinophils Absolute: 0 10*3/uL (ref 0.0–0.7)
Eosinophils Relative: 0 % (ref 0–5)
Monocytes Absolute: 0.3 10*3/uL (ref 0.1–1.0)
Monocytes Relative: 6 % (ref 3–12)

## 2011-05-21 LAB — CBC
MCH: 29.9 pg (ref 26.0–34.0)
MCHC: 34.6 g/dL (ref 30.0–36.0)
Platelets: 209 10*3/uL (ref 150–400)
RDW: 18.7 % — ABNORMAL HIGH (ref 11.5–15.5)

## 2011-05-21 LAB — CK TOTAL AND CKMB (NOT AT ARMC): Total CK: 26 U/L (ref 7–177)

## 2011-05-30 NOTE — Discharge Summary (Signed)
[name], [name]              ACCOUNT NO.:  [id number]  MEDICAL RECORD NO.:  [id number]           PATIENT TYPE:  O  LOCATION:  RAD                           FACILITY:  [facility]  PHYSICIAN:  Jeoffrey Massed, MD    DATE OF BIRTH:  23-Apr-1919  DATE OF ADMISSION:  12/24/2010 DATE OF DISCHARGE:                        DISCHARGE SUMMARY - REFERRING   PRIMARY CARE PHYSICIAN:  Florentina Jenny, M.D.  PRIMARY DISCHARGE DIAGNOSES: 1. Lower gastrointestinal bleeding, either secondary to diverticula or     malignant. 2. Sigmoid stricture, probably a diverticular stricture but unable to     rule out malignancy. 3. Paroxysmal supraventricular tachycardias, now in sinus rhythm. 4. Anemia secondary to blood loss. 5. Dementia with delirium, now much more stable. 6. Hypertension. 7. Syncope.  DISCHARGE MEDICATIONS: 1. Depakote 250 mg 1 tablet p.o. twice daily. 2. Lopressor 25 mg 1 tablet p.o. twice daily. 3. Protonix 40 mg 1 tablet daily. 4. Ferrous sulfate 325 mg 1 tablet p.o. twice daily. 5. Lorazepam 0.5 mg half a tablet p.o. q.8h p.r.n. 6. Acetaminophen 325 mg 2 tablets p.o. daily. 7. Citracal plus vitamin D, 2 tablets p.o. twice daily. 8. Colace 100 mg, 1 capsule p.o. twice daily. 9. Exelon 1.5 mg 1 capsule p.o. twice daily. 10.Fosamax 70 mg 1 tablet p.o. weekly. 11.Great Shakes one bottle p.o. three times a day with meals. 12.MiraLAX 17 g p.o. twice daily.  CONSULTATIONS: 1. [facility]. 2. Dr. Sharyn Lull from Psychiatry. 3. [facility].  BRIEF HISTORY OF PRESENT ILLNESS:  The patient is an 77 year old, black female who was initially a resident of an assisted living facility who presented on the December 19, 2010 with bright red blood per rectum.  She was then admitted to the Hospitalist Service for further evaluation and treatment.  For further details, please see the History and Physical that was dictated by Dr. Ashley Royalty on admission.  PERTINENT LABORATORY DATA: 1. Last set of  H&H done today shows a hemoglobin of 9.2. 2. Chemistries done today shows a sodium of 143, potassium of 3.9,     chloride of 108, bicarbonate of 29, BUN of 7, creatinine of 1.06.  PERTINENT RADIOLOGICAL STUDIES: 1. CT of abdomen and pelvis with contrast showed focal area of soft     tissue attenuation within the mid-sigmoid colon results in lumen     narrowing.  This responses to high-grade stricture seen on barium     enema and is consistent with adenocarcinoma.  Correlation with     biopsy results is recommended. 2. Single contrast barium enema.  The exam was somewhat limited due to     the patient's condition and the presence of high-grade stricture in     the sigmoid colon.  Nonetheless, it is positive for a fixed     narrowing in the sigmoid colon that is highly likely to represent     adenocarcinoma.  The remainder of the colon shows no gross     abnormality other than diverticulosis.  PROCEDURES PERFORMED:  Attempted sigmoidoscopy on December 22, 2010, which showed fresh blood in the rectum and sigmoid.  Possible extrinsic compression or edema of mucosal  folds, narrowing lumen unable to advance beyond 30 cm.  No active bleeding seen.  BRIEF HOSPITAL COURSE: 1. Lower gastrointestinal bleed.  This is most likely secondary to     diverticulosis.  The patient was admitted to the Hospitalist     Service, and H&H was followed.  During the course of     hospitalization, she had a drop in hemoglobin and hematocrit and     was subsequently transfused PRBC.  In total she required for units     of PRBC.  She was also seen in consult with [facility], and     subsequently taken for a flexible sigmoidoscopy on December 22, 2010, the results of which are noted as above.  There was a     stricture at 30 cm, which could not be traversed.  Apparently, she     did have a similar stricture 5 years ago.  Plans were then to do a     gastrograph and enema for better clarification.  However,  because     of the patient's delirium, this was not be done right away.  It was     then subsequently done on December 24, 2010.  The results of which     are noted as above.  During this time, the patient continued to     have on and off intermittent rectal bleeding.  However, she did not     require any further blood transfusion.  Hemoglobin was holding     around 9-10 range.  Upon further discussion with the     gastroenterologist, a CT scan of the abdomen was then ordered and     was subsequently done on December 26, 2010, the results of which are     noted as above.  The sigmoid stricture then prompted a surgical     consultation after a long discussion with the family as they were     keen in exploring further options.  The patient was then seen in     consultation by Dr. Abbey Chatters from [facility]     yesterday, and per his discussion with the family, the family then     decided to only pursue surgery as a last resort.  At this point, it     is thought that this is more likely a diverticular stricture other     than a malignant stricture as it has been present for the past 5-6     years.  I had a detailed discussion with the patient's son, Mr.     Meda Klinefelter, who is also the healthcare power of attorney,     numerous times, and at this point, it is felt that the patient's     family does not want any aggressive or heroic measures, and they     would only want aggressive means if and only if as a last resort if     it could improve the patient's quality of life.  Current plans are     for continued observation and subsequent transfer of this patient     to a skilled nursing facility.  The patient's family feel that an     assisted living facility is now not appropriate for her.  The     social worker is working with the family, and the plan is to     transfer her when a bed is available.  Please do note that the  patient has had no hematochezia for almost 3 days  now.  Hemoglobin     and hematocrit are stable. 2. Syncope.  This was an in-hospital event and was more likely     secondary to the ongoing GI bleeding, frailty balance issues as     well as orthostatic.  Given numerous comorbidities, no further     investigations were performed.  A 2-D echocardiogram done on the     December 23, 2010 showed an EF around 65-70%. 3. Supraventricular tachycardia.  The patient had numerous episodes of     supraventricular tachycardia throughout the early part of her     admission.  At one point, she required a Cardizem drip while she     was in the step-down unit.  This SVT was broken by Lopressor IV.     She has now subsequently been placed on p.o. Lopressor.  Because of     the recurrent nature of this arrhythmia, Cardiology was also     consulted. 4. Dementia with delirium.  The patient has had in the early part of     the admission numerous issues with delirium.  Apparently, this     patient does have a history of a significant amount of dementia in     the past.  Because of the delirium, we had difficulty completing a     lot of her radiological investigations.  Initially, this delirium     was not responding IV Lopressor as well as IV Haldol.  Seroquel was     not used as the patient was having arrhythmias with a borderline     prolonged QTC as well.  The prolongation of QTC was secondary to     hypokalemia and other electrolyte imbalance.  In any event,     psychiatric assistance was sought and Dr. Sharyn Lull did suggest maybe     starting her on low-dose Depakote, which has been done with good     results.  The patient has not had any requirements for restraints     at all.  However, she does require a sitter at bedside, and as     noted above, she has had one fall on this admission already.  Plans     are to continue with Depakote and use lorazepam on a p.r.n. basis. 5. Hypertension.  She was initially on ACE inhibitor that has now been      discontinued and transitioned to Lopressor as it will help with her     SVT as well.  CODE STATUS:  The patient is a DNR/no code blue.  This was confirmed numerous times by me with his son, [name].  DISPOSITION:  Current plans are to transfer this patient to a skilled nursing facility when a bed is available.  At this point, she has no bleeding and is considered stable for transfer when a bed is available.  FOLLOWUP INSTRUCTIONS:  The patient to follow up with attending physician at a skilled nursing facility in the next few days. The patient will need follow up with [facility] within 1-2 weeks upon discharge. Periodic hemoglobin and hematocrit will need to be checked perhaps weekly at the skilled nursing facility.  Total time spent is 60 minutes.     Jeoffrey Massed, MD     SG/MEDQ  D:  12/28/2010  T:  12/28/2010  Job:  [id number]  cc:   Fayrene Fearing L. Malon Kindle., M.D. Fax: [phone]  Adolph Pollack,  M.D. [address] [place] [address]  Florentina Jenny, M.D.  Electronically Signed by Jeoffrey Massed  on 01/10/2011 04:11:57 PM

## 2011-05-30 NOTE — Consult Note (Signed)
NAMECONCHA, SUDOL              ACCOUNT NO.:  [id number]  MEDICAL RECORD NO.:  [id number]           PATIENT TYPE:  O  LOCATION:  RAD                           FACILITY:  [facility]  PHYSICIAN:  Adolph Pollack, M.D.DATE OF BIRTH:  03-Jul-1919  DATE OF CONSULTATION:  12/27/2010 DATE OF DISCHARGE:                                CONSULTATION   REQUESTING PHYSICIAN:  Jeoffrey Massed, MD.  REASON FOR CONSULTATION:  Sigmoid stricture.  HISTORY OF PRESENT ILLNESS:  [name] is an 77 year old African- American female who presented to the hospital with bright red blood per rectum.  She has a significant history of dementia and has a tough time giving any accurate history.  She is a resident of [facility], and the patient was apparently noted to have bright red blood per rectum and she was sent over to the emergency department.  They apparently denied any fever, chills, vomiting, or diarrhea.  However, the patient was admitted for bright red blood per rectum.  Her hospital course has included significant workup and has come up with evidence of a significant sigmoid stricture.  In fact, there has been an evidence of this finding dating back to 2006 both on barium enema and on CT.  She has also apparently had an endoscopy in 2006 with biopsies, which were negative for malignancy.  However, during that endoscopy as well as endoscopy this admission, the gastroenterologists have been unable to advance past the sigmoid stricture into the more proximal colon.  They apparently did not see any evidence of mass lesion and therefore no additional biopsy has been performed this admission.  The patient has stabilized from a GI bleed standpoint.  Her hemoglobin is stable and has not had bright red blood per rectum in the last 24 hours.  The gastroenterologists consulted have apparently discussed with some of the family that perhaps surgery is an option given the extent of her obstructive findings  and bleeding.  The family has again asked for surgical consultation to identify the surgical risks and potential complications of aggressive treatment options.  Apparently the daughter initially indicated the family was not interested in pursuing very aggressive measures; however, the son would at least like to hear what the aggressive measures would be.  PAST MEDICAL HISTORY:  Reviewed and found consistent with significant dementia, hypertension, osteoporosis, chronic constipation, diverticulosis, iron-deficiency anemia, and gastroesophageal reflux disease.  PAST SURGICAL HISTORY:  Does not appear to have any abdominal surgical history.  FAMILY HISTORY:  Unobtainable at this point.  SOCIAL HISTORY:  No alcohol, tobacco, or illicit drug use.  She is a resident of [facility].  MEDICATIONS: 1. Tylenol. 2. Vasotec. 3. Lorazepam. 4. Fosamax. 5. MiraLax. 6. Iron. 7. Vicodin. 8. Aspirin 81 mg. 9. Prilosec. 10.Calcium.  ALLERGIES:  SULFA.  REVIEW OF SYSTEMS:  The patient is present by herself in the room, and I am unable to obtain an accurate review of systems.  PHYSICAL EXAMINATION:  GENERAL:  An 77 year old elderly African-American female who does not appear toxic or in any acute distress. CURRENT VITAL SIGNS:  Temperature of 98.4, heart rate of 88, blood pressure  of 102/68, respiratory rate of 20, and oxygen saturation of 98% on room air. ENT:  Unremarkable. NECK:  Supple without lymphadenopathy.  Trachea is midline.  No thyromegaly or masses. LUNGS:  Clear.  No wheezes, rhonchi, or rales. HEART:  Regular rate and rhythm.  No murmurs, gallops, or rubs. Carotids 2+ and brisk without bruit. ABDOMEN:  Soft, nondistended, and nontender.  No mass effects or hernias are appreciated.  Active bowel sounds are heard. RECTAL:  Exam is deferred. EXTREMITIES:  Decreased range of motion all extremities with some crepitus but no pain.  Muscle strength and tone is 5/5 without  atrophy. NEUROLOGIC:  The patient is alert and oriented x2.  She demonstrates no gross neurologic deficit.  DIAGNOSTICS:  Most recent labs show a CBC with white blood cell count of 7.0, hemoglobin of 10.2, hematocrit of 30.4, and platelet count of 269,000.  Metabolic panel shows a sodium of 143, potassium 3.7, chloride of 106, CO2 of 28, BUN of 5, creatinine of 1.0, and glucose of 86.  I do not see a CEA level ordered this admission.  Imaging CT scan of the abdomen and pelvis performed yesterday again shows evidence of thickened sigmoid colon, possible mass effect in the sigmoid colon.  There is an uncharacterized lesion in the liver too small to be characterized for possible malignancy.  Her barium enema several days ago again showed significant sigmoid stricture; however, contrast did progress into the proximal colon with the findings of significant diverticulosis.  IMPRESSION: 1. Certainly favor sigmoid diverticular stricture given the presence     of findings all the way dating back to 2006, would not expect a     colonic cancer to be present this long without some secondary     findings. 2. Secondary lower gastrointestinal bleed, likely diverticular in     nature but malignancy again cannot be completely ruled out given no     recent biopsy.  Question whether CEA level should be checked. 3. Dementia. 4. Hypertension.  PLAN:  Not sure if checking a CEA level would benefit or change a decision making in this case.  The patient does not appear capable of making her own decisions and obviously family discussion is required. Given the severity of the stricture, certainly surgical resection or at least diversion would be necessary to allow for symptomatic improvement. However, this procedure would certainly carry significant risks of general anesthesia, wound infection, respiratory failure, renal failure, and possible complications that could ultimately result in death.   These risks and potential complications will be explained in detail to the patient's family as well as the patient before any decision can be made on surgical intervention.  Her hemoglobin is stable right now.  There does not appear to be any active bleeding going on.  If the surgery is decided upon, we will try to gently prep the patient if at all possible from both above and below. Dr. Abbey Chatters will be following this patient.  We appreciate the opportunity to consult and participate in her care.     Brayton El, PA-C   ______________________________ Adolph Pollack, M.D.    KB/MEDQ  D:  12/27/2010  T:  12/27/2010  Job:  [id number]  cc:   Jeoffrey Massed, MD  Electronically Signed by Brayton El  on 12/29/2010 09:30:30 AM Electronically Signed by Avel Peace M.D. on 01/04/2011 09:48:30 AM

## 2011-08-19 LAB — DIFFERENTIAL
Basophils Relative: 2 — ABNORMAL HIGH
Eosinophils Absolute: 0
Monocytes Absolute: 0.4
Monocytes Relative: 3

## 2011-08-19 LAB — COMPREHENSIVE METABOLIC PANEL
ALT: 11
AST: 14
Albumin: 3.2 — ABNORMAL LOW
Alkaline Phosphatase: 41
GFR calc Af Amer: 57 — ABNORMAL LOW
Glucose, Bld: 156 — ABNORMAL HIGH
Potassium: 4.2
Sodium: 136
Total Protein: 7.3

## 2011-08-19 LAB — CBC
Hemoglobin: 13.8
Platelets: 342
RDW: 12.6

## 2011-08-19 LAB — URINALYSIS, ROUTINE W REFLEX MICROSCOPIC
Glucose, UA: NEGATIVE
Protein, ur: NEGATIVE

## 2011-08-19 LAB — POCT CARDIAC MARKERS
CKMB, poc: 2
Operator id: 264031
Troponin i, poc: <0.05

## 2011-08-19 LAB — TSH: TSH: 0.275 — ABNORMAL LOW

## 2011-08-19 LAB — URINE MICROSCOPIC-ADD ON

## 2011-08-19 LAB — LACTIC ACID, PLASMA: Lactic Acid, Venous: 1.8

## 2012-02-21 IMAGING — CR DG BE W/ CM - WO/W KUB
2 series · 2 of 2 positions shown · IV contrast (agent unspecified)
Comparison: None.

CLINICAL DATA: General GI bleed. Stricture at 30 cm at colonoscopy.

SINGLE CONTRAST BARIUM ENEMA
Fluoroscopy Time: 3.0 minutes
Contrast: 7144 ml of diluted water soluble contrast ([id number])

[view not recorded (1 of 2)]
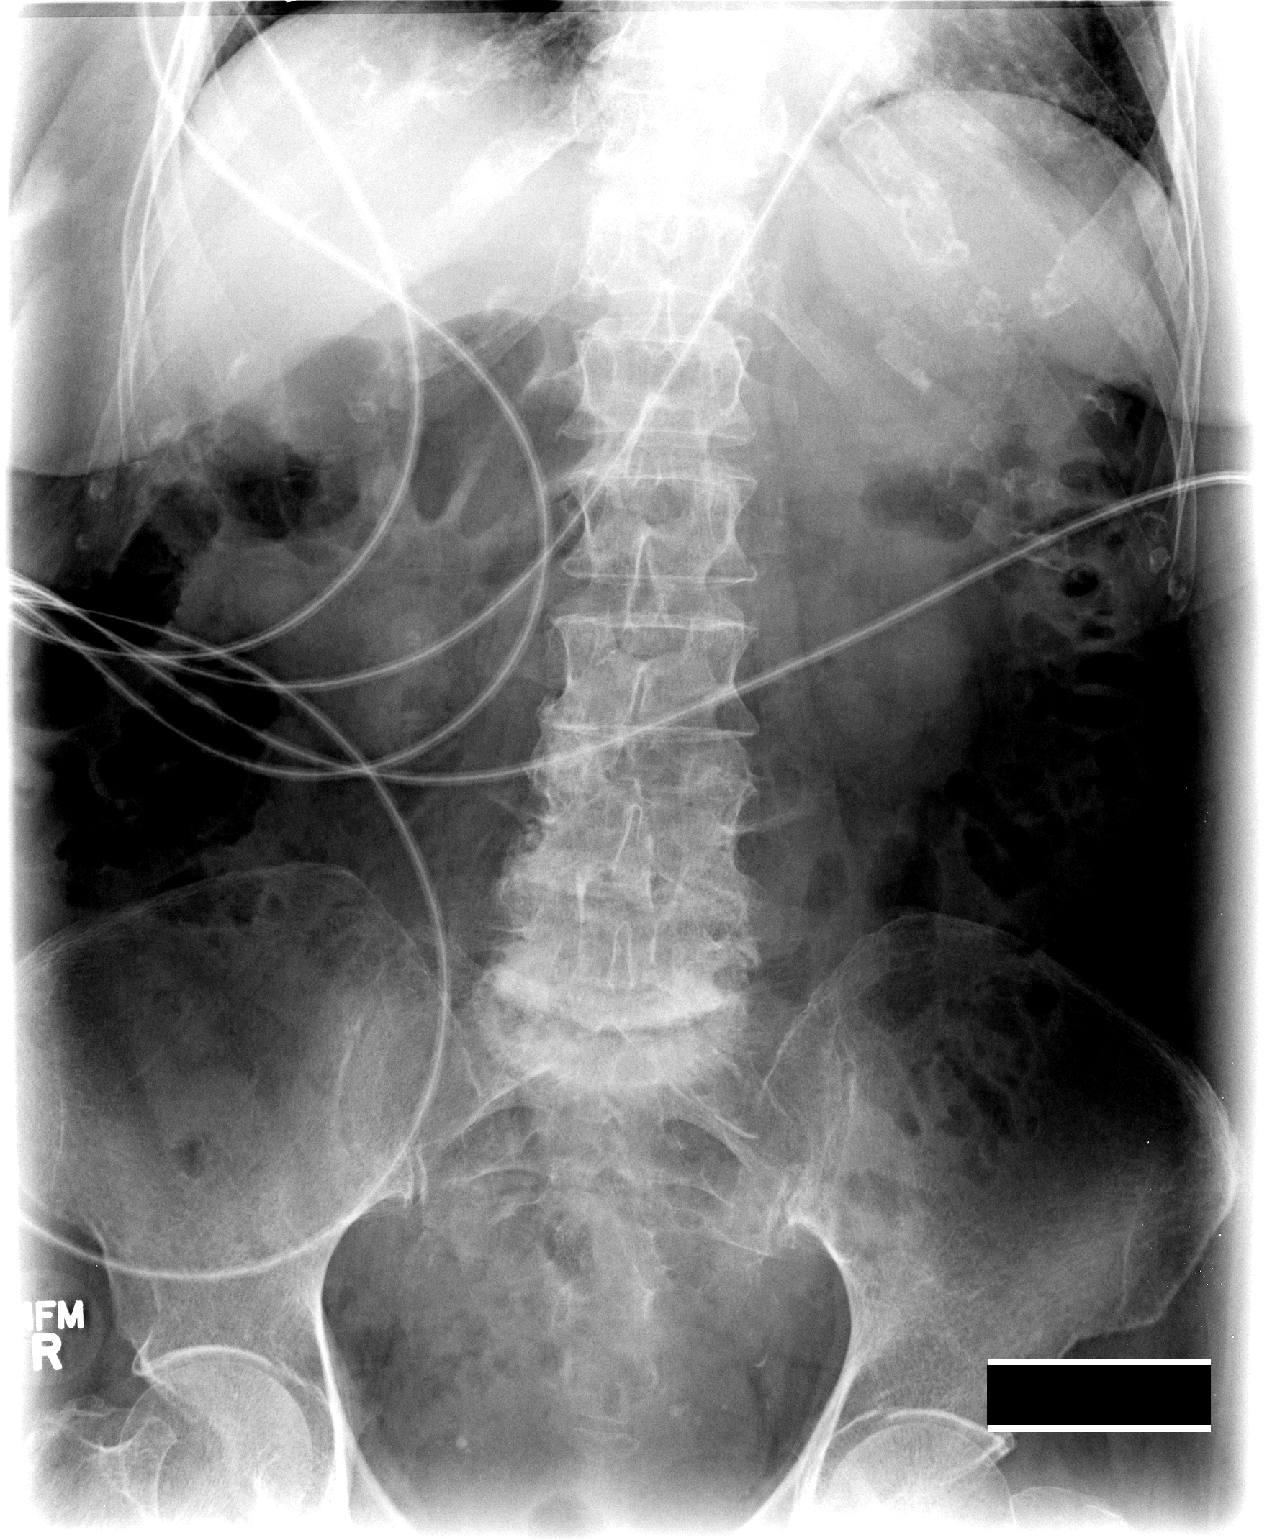

[view not recorded (2 of 2)]
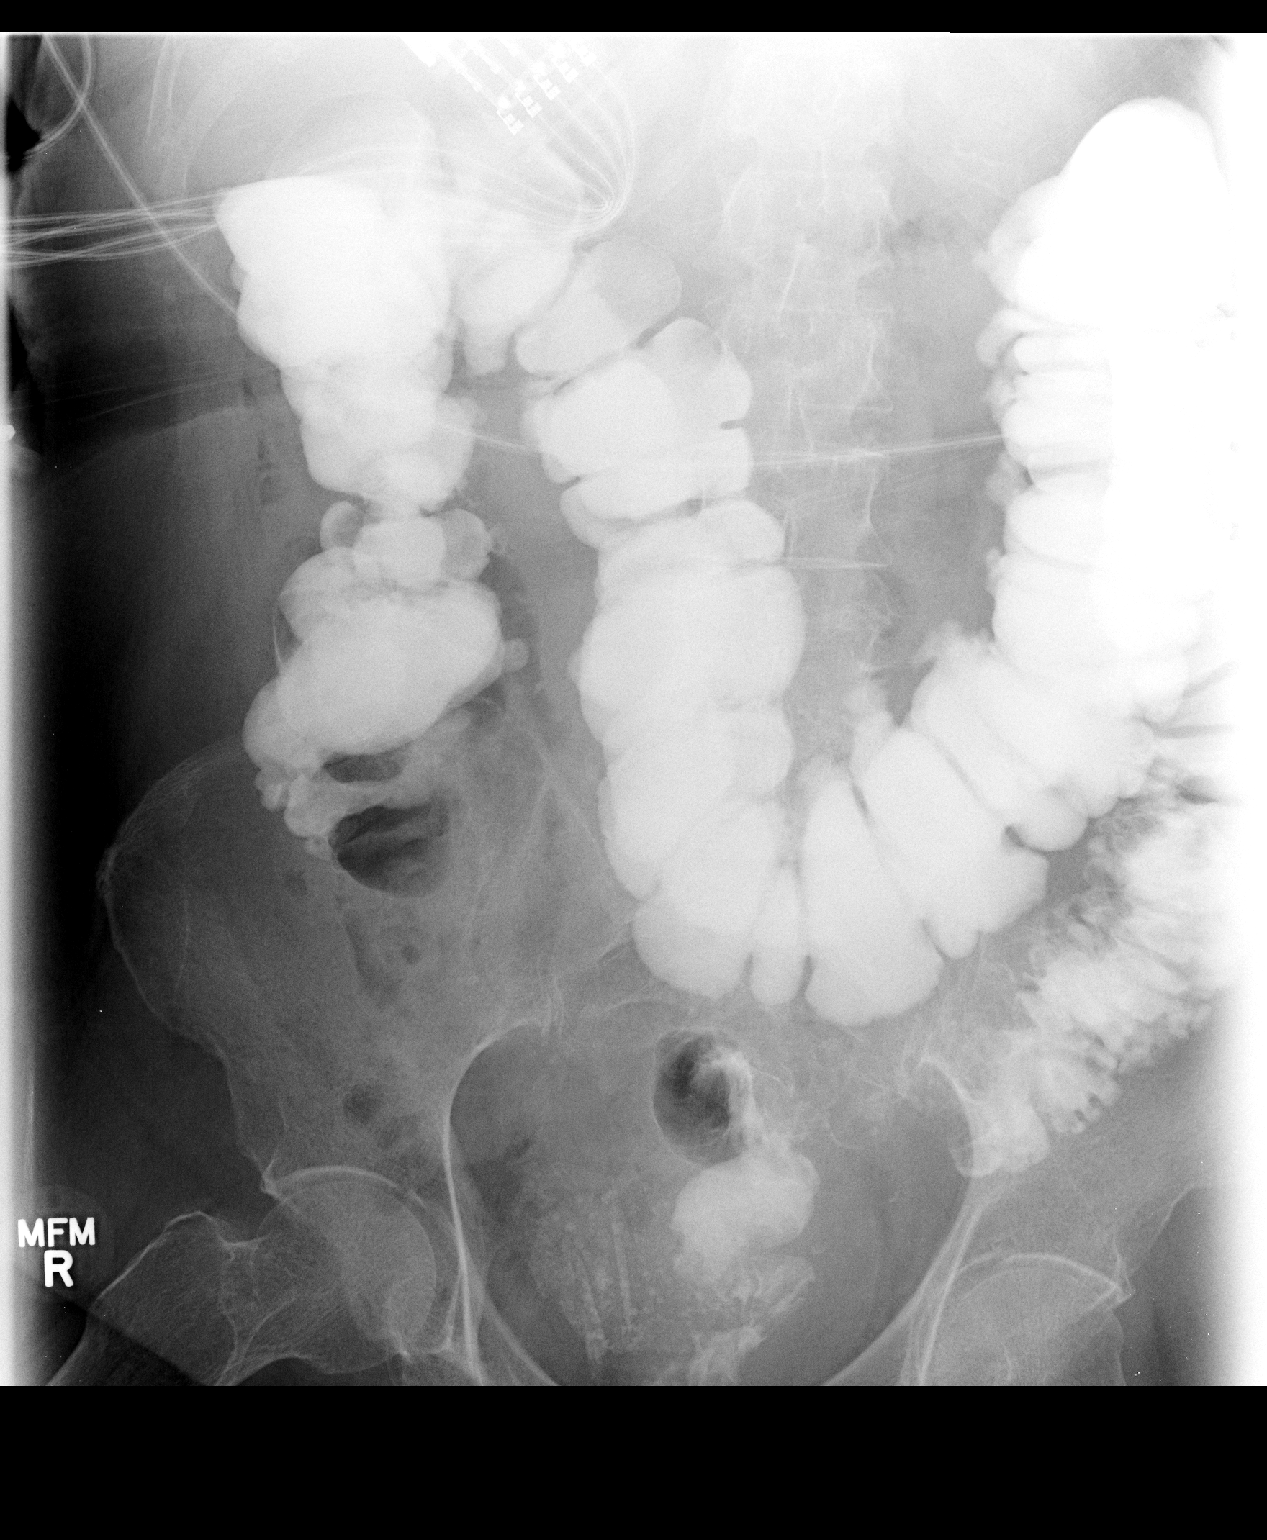

[2 of 2 positions shown; findings below may reference images not displayed]

FINDINGS: The exam was challenging because the patient was
uncooperative and somewhat combative.

Scout view shows advanced degenerative disc disease of the lumbar
spine.  The contrast was introduced into  the rectum and was
successfully advanced to the cecum with the aid of a large Mariama Lemke
catheter.

There is a fixed narrowing in the sigmoid colon.  It has an apple
core appearance with overhanging edges, and is highly likely to
represent an adenocarcinoma.  The remainder of the colon is
suboptimally distended because there is a fairly high-grade
obstruction.  There is diverticulosis without other definite
lesions of the mucosa.
IMPRESSION: 1.  The exam is somewhat limited due to the patient's condition and
the presence of a high-grade stricture in the sigmoid colon.
Nonetheless, it is positive for a fixed narrowing in the sigmoid
colon that is highly likely to represent adenocarcinoma.
2.  The remainder of the colon shows no gross abnormality other
than diverticulosis.  It could not been maximally distended  beyond
the sigmoid lesion.

## 2012-02-29 IMAGING — CR DG HIP (WITH OR WITHOUT PELVIS) 2-3V*L*
2 series · 2 of 2 positions shown · non-contrast
Comparison: 04/07/2006

CLINICAL DATA: Fall with left hip pain.

LEFT HIP - COMPLETE 2+ VIEW

[view not recorded (1 of 2)]
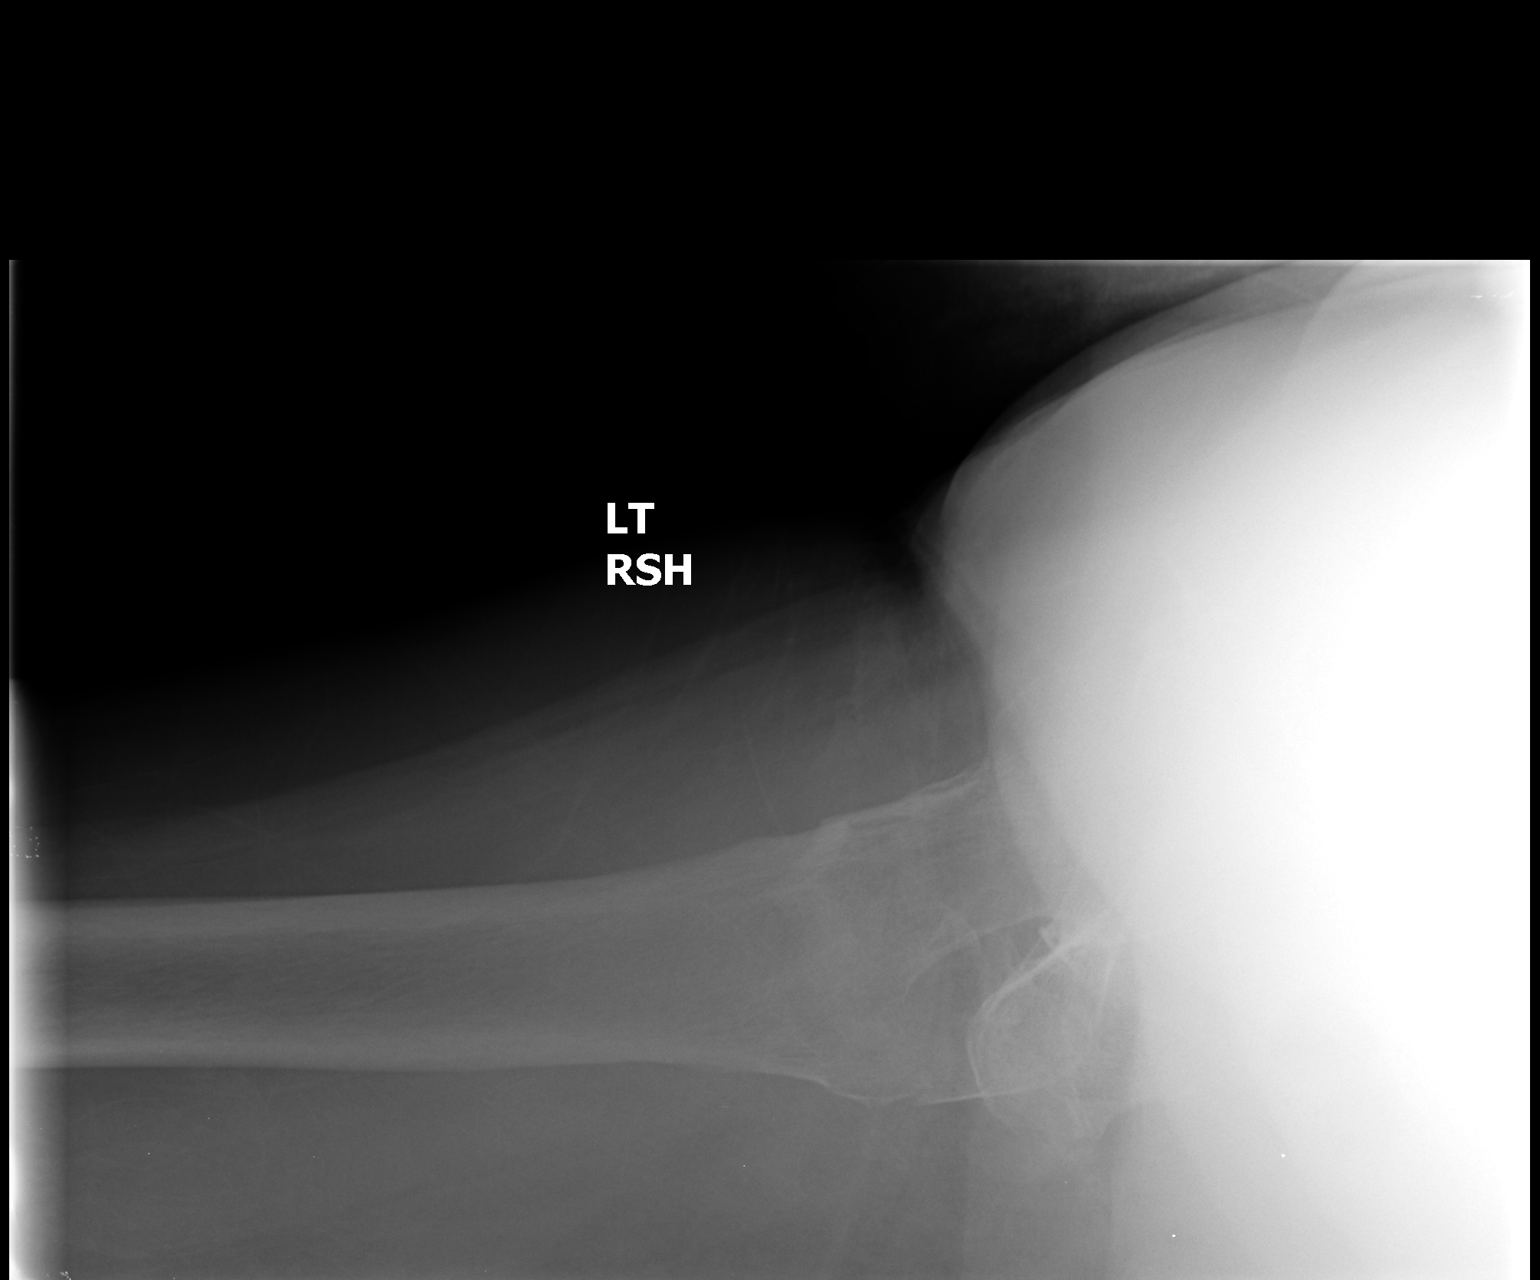

[view not recorded (2 of 2)]
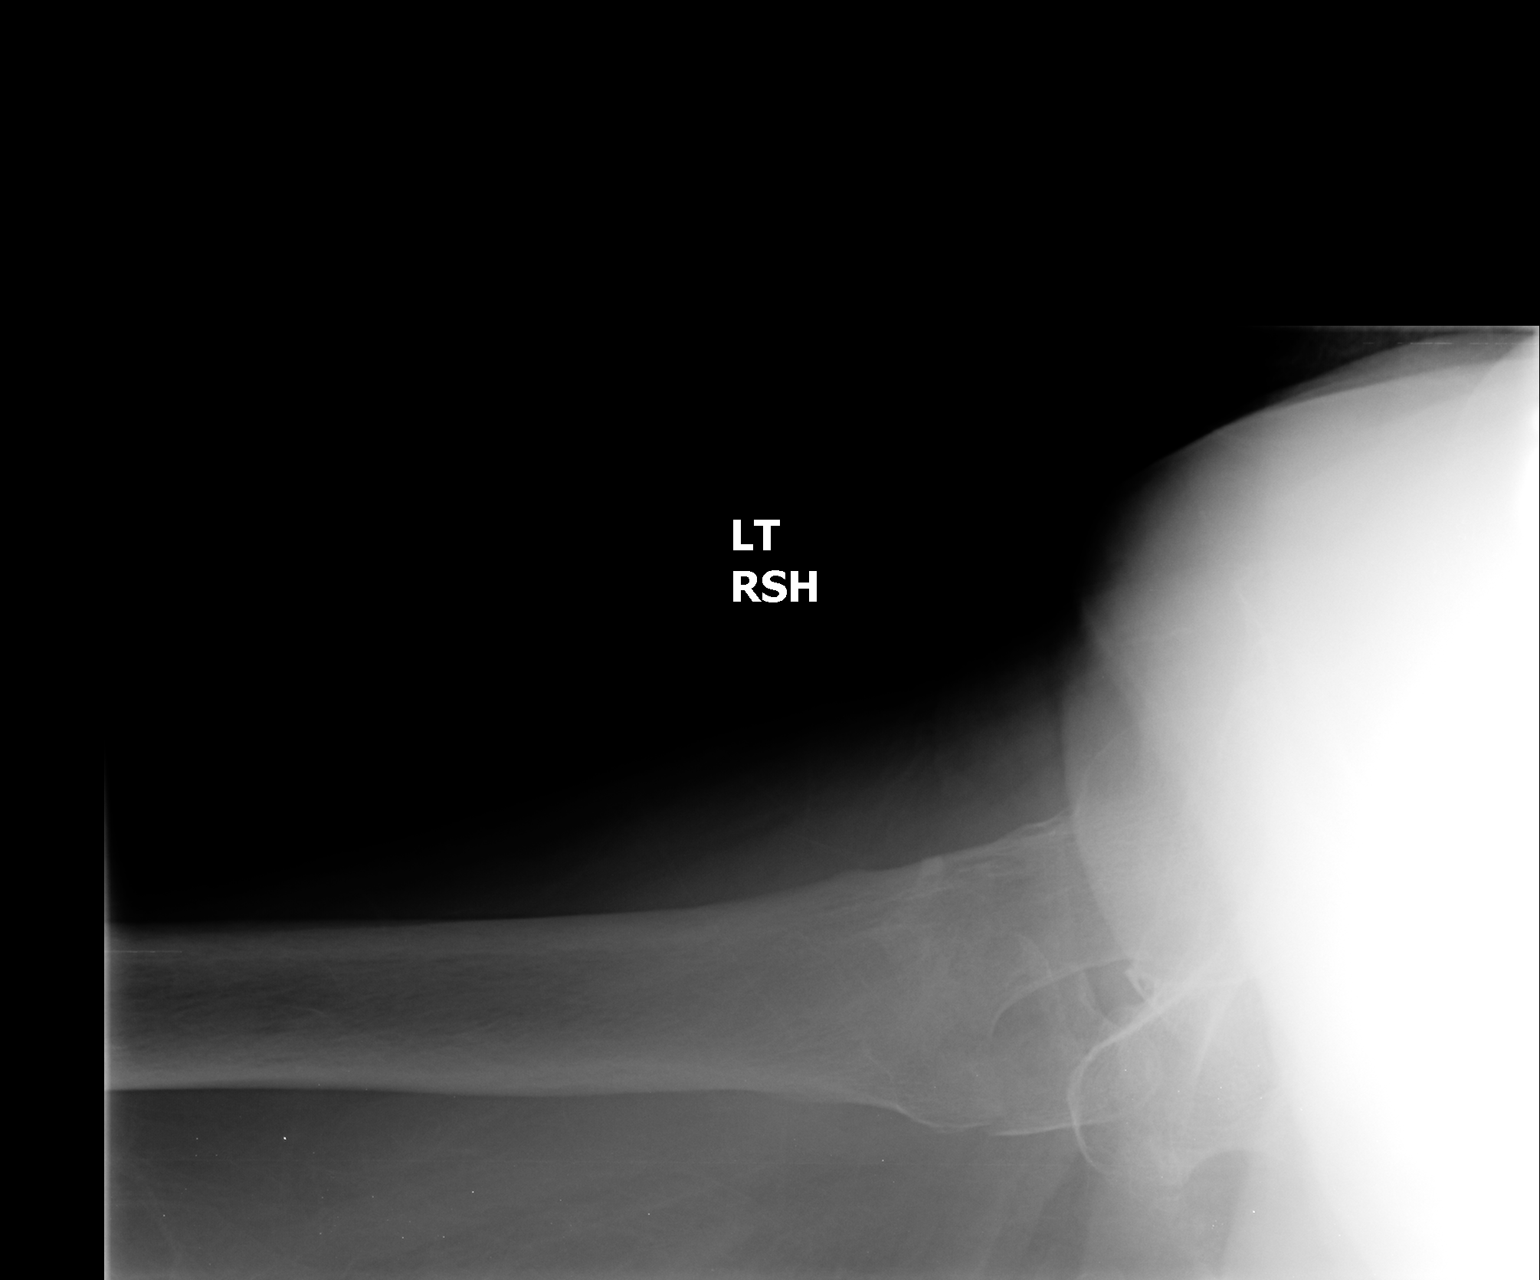

[2 of 2 positions shown; findings below may reference images not displayed]

FINDINGS: An intertrochanteric fracture of the left femur is noted
with 1 cm superior displacement.
There is no evidence of subluxation or dislocation.
No other fractures are identified.
Degenerative changes of the lower lumbar spine noted.

Per CMS PQRS reporting requirements (PQRS Measure 24): Given the
patient's age of greater than 50 and the fracture site (hip, distal
radius, or spine), the patient should be tested for osteoporosis
using DXA, and the appropriate treatment considered based on the
DXA results.
IMPRESSION: Left intertrochanteric femur fracture.

## 2013-02-22 ENCOUNTER — Non-Acute Institutional Stay (SKILLED_NURSING_FACILITY): Payer: Medicare Other | Admitting: Adult Health

## 2013-02-22 ENCOUNTER — Encounter: Payer: Self-pay | Admitting: Adult Health

## 2013-02-22 DIAGNOSIS — Z66 Do not resuscitate: Secondary | ICD-10-CM

## 2013-02-22 DIAGNOSIS — F0280 Dementia in other diseases classified elsewhere without behavioral disturbance: Secondary | ICD-10-CM

## 2013-02-22 DIAGNOSIS — G309 Alzheimer's disease, unspecified: Secondary | ICD-10-CM

## 2013-02-22 DIAGNOSIS — F411 Generalized anxiety disorder: Secondary | ICD-10-CM

## 2013-02-22 DIAGNOSIS — K219 Gastro-esophageal reflux disease without esophagitis: Secondary | ICD-10-CM | POA: Insufficient documentation

## 2013-02-22 DIAGNOSIS — M545 Low back pain: Secondary | ICD-10-CM

## 2013-02-22 DIAGNOSIS — K5900 Constipation, unspecified: Secondary | ICD-10-CM

## 2013-02-22 DIAGNOSIS — I10 Essential (primary) hypertension: Secondary | ICD-10-CM

## 2013-02-22 NOTE — Assessment & Plan Note (Signed)
Is stable is not taking medications at this time 

## 2013-02-22 NOTE — Assessment & Plan Note (Signed)
She is presently stable there are no  Reports of behavioral issues present is not taking medications

## 2013-02-22 NOTE — Assessment & Plan Note (Signed)
Is stable is taking 25 mg twice daily

## 2013-02-22 NOTE — Assessment & Plan Note (Signed)
No complaints of pain present is taking a lidoderm patch to her lower back daily is taking routinely vicodin 5/325 mg every 6 hours a

## 2013-02-22 NOTE — Progress Notes (Signed)
Patient ID: [name], female   DOB: 15-Jul-1919, 79 y.o.   MRN: [id number]  Chief Complaint  Patient presents with  . Medical Managment of Chronic Issues    HPI:  Essential hypertension, benign Is stable is taking 25 mg twice daily   GERD (gastroesophageal reflux disease) Is stable is not taking medications at this time  Unspecified constipation Is stable is taking miralax daily and is taking colace twice daily   Alzheimer's disease No significant change in her status; she is not on medications at this time  Anxiety state, unspecified She is presently stable there are no  Reports of behavioral issues present is not taking medications   Low back pain No complaints of pain present is taking a lidoderm patch to her lower back daily is taking routinely vicodin 5/325 mg every 6 hours a   Past Medical History  Diagnosis Date  . Anxiety   . Anemia   . Hypertension   . Arthritis   . Osteoporosis   . Weight loss     Past Surgical History  Procedure Laterality Date  . Left interochanteric orif    . Attempted sigmoidoscopy      VITAL SIGNS BP 116/74  Pulse 74  Ht 5\' 3"  (1.6 m)  Wt 124 lb (56.246 kg)  BMI 21.97 kg/m2   Patient's Medications  New Prescriptions   No medications on file  Previous Medications   CALCIUM CARB-CHOLECALCIFEROL (CALCIUM + D3) 600-200 MG-UNIT TABS    Take 1 tablet by mouth daily.   DOCUSATE SODIUM (COLACE) 100 MG CAPSULE    Take 100 mg by mouth 2 (two) times daily.   HYDROCODONE-ACETAMINOPHEN (NORCO/VICODIN) 5-325 MG PER TABLET    Take 1 tablet by mouth every 6 (six) hours.   LIDOCAINE (LIDODERM) 5 %    Place 1 patch onto the skin daily. To lower back on in the am and off in the pm   METOPROLOL TARTRATE (LOPRESSOR) 25 MG TABLET    Take 25 mg by mouth 2 (two) times daily.   POLYETHYLENE GLYCOL (MIRALAX / GLYCOLAX) PACKET    Take 17 g by mouth daily.  Modified Medications   No medications on file  Discontinued Medications   No  medications on file    SIGNIFICANT DIAGNOSTIC EXAMS   01-13-13: wbc 6.0; hgb 11.9; hct 25.5; mcv 90.1; plt 249; glucose 93; bun 15; creat 0.67; k+4.4 Na++141 01-15-13: urine culture: e-coli: ampicillin  Review of Systems  Unable to perform ROS   Physical Exam  Constitutional:  frail  Neck: Neck supple.  Cardiovascular: Normal rate, regular rhythm and intact distal pulses.   Respiratory: Effort normal and breath sounds normal.  GI: Soft. Bowel sounds are normal.  Musculoskeletal:  Is able to move extremities  Neurological: She is alert.  Oriented to self only  Skin: Skin is warm and dry.       ASSESSMENT/ PLAN: Will not make any changes in her regimen at this time; will continue to monitor her status and will make changes as indicated in the future.   FUTURE ORDERS:    none at this time.

## 2013-02-22 NOTE — Assessment & Plan Note (Signed)
Is stable is taking miralax daily and is taking colace twice daily

## 2013-02-22 NOTE — Assessment & Plan Note (Signed)
No significant change in her status; she is not on medications at this time

## 2013-04-17 ENCOUNTER — Non-Acute Institutional Stay (SKILLED_NURSING_FACILITY): Payer: Medicare Other | Admitting: Internal Medicine

## 2013-04-17 DIAGNOSIS — F0280 Dementia in other diseases classified elsewhere without behavioral disturbance: Secondary | ICD-10-CM

## 2013-04-17 DIAGNOSIS — M545 Low back pain, unspecified: Secondary | ICD-10-CM

## 2013-04-17 DIAGNOSIS — I10 Essential (primary) hypertension: Secondary | ICD-10-CM

## 2013-04-17 DIAGNOSIS — K5900 Constipation, unspecified: Secondary | ICD-10-CM

## 2013-04-17 DIAGNOSIS — G309 Alzheimer's disease, unspecified: Secondary | ICD-10-CM

## 2013-04-17 DIAGNOSIS — M810 Age-related osteoporosis without current pathological fracture: Secondary | ICD-10-CM

## 2013-04-17 NOTE — Progress Notes (Signed)
Patient ID: [name], female   DOB: 10/09/1919, 79 y.o.   MRN: [id number] [facility]  Kreg Earhart L. Renato Gails, D.O., C.M.D.  Chief Complaint: med mgt of chronic diseases HPI:  79 yo female with dementia, failure to thrive, htn, chronic low back pain, osteoporosis and constipation was seen for regulatory visit.    Review of Systems:  Review of Systems  Unable to perform ROS: dementia   Medications: Patient's Medications  New Prescriptions   No medications on file  Previous Medications   CALCIUM CARB-CHOLECALCIFEROL (CALCIUM + D3) 600-200 MG-UNIT TABS    Take 1 tablet by mouth daily.   DOCUSATE SODIUM (COLACE) 100 MG CAPSULE    Take 100 mg by mouth 2 (two) times daily.   HYDROCODONE-ACETAMINOPHEN (NORCO/VICODIN) 5-325 MG PER TABLET    Take 1 tablet by mouth every 6 (six) hours.   LIDOCAINE (LIDODERM) 5 %    Place 1 patch onto the skin daily. To lower back on in the am and off in the pm   METOPROLOL TARTRATE (LOPRESSOR) 25 MG TABLET    Take 25 mg by mouth 2 (two) times daily.   POLYETHYLENE GLYCOL (MIRALAX / GLYCOLAX) PACKET    Take 17 g by mouth daily.  Modified Medications   No medications on file  Discontinued Medications   No medications on file   Physical Exam: Physical Exam  Nursing note and vitals reviewed. Constitutional: No distress.  Frail female  HENT:  Head: Normocephalic and atraumatic.  Eyes: Pupils are equal, round, and reactive to light.  Cardiovascular: Normal rate, regular rhythm, normal heart sounds and intact distal pulses.   Pulmonary/Chest: Effort normal and breath sounds normal. No respiratory distress.  Abdominal: Soft. Bowel sounds are normal. She exhibits no distension. There is no tenderness.  Musculoskeletal: Normal range of motion.  Neurological: She is alert.  Oriented to self  Skin: Skin is warm and dry.   Labs reviewed: Reviewed facility labs, studies in chart  Assessment/Plan 1. Alzheimer's disease End stages, palliative care  at this point, off memory meds, oriented to self only, dependent in adls  2. Low back pain Chronic, helped with lidoderm patch and routine hydrocodone use  3. Essential hypertension, benign At goal, no changes needed  4. Unspecified constipation On regular bowel regimen w/o complications  5. Osteoporosis Continues on calcium with vitamin D  Family/ staff Communication: discussed with pt's nurse  Goals of care: DNR

## 2013-04-18 ENCOUNTER — Other Ambulatory Visit: Payer: Self-pay

## 2013-04-18 MED ORDER — HYDROCODONE-ACETAMINOPHEN 5-325 MG PO TABS: ORAL_TABLET | ORAL | Status: DC

## 2013-06-04 ENCOUNTER — Non-Acute Institutional Stay: Payer: Medicare Other | Admitting: Adult Health

## 2013-06-04 DIAGNOSIS — K5900 Constipation, unspecified: Secondary | ICD-10-CM

## 2013-06-04 DIAGNOSIS — N390 Urinary tract infection, site not specified: Secondary | ICD-10-CM

## 2013-06-04 DIAGNOSIS — M545 Low back pain, unspecified: Secondary | ICD-10-CM

## 2013-06-04 DIAGNOSIS — F0280 Dementia in other diseases classified elsewhere without behavioral disturbance: Secondary | ICD-10-CM

## 2013-06-04 DIAGNOSIS — I10 Essential (primary) hypertension: Secondary | ICD-10-CM

## 2013-06-05 ENCOUNTER — Non-Acute Institutional Stay (SKILLED_NURSING_FACILITY): Payer: Medicare Other | Admitting: Internal Medicine

## 2013-06-05 DIAGNOSIS — M810 Age-related osteoporosis without current pathological fracture: Secondary | ICD-10-CM

## 2013-06-05 DIAGNOSIS — I10 Essential (primary) hypertension: Secondary | ICD-10-CM

## 2013-06-05 DIAGNOSIS — F0280 Dementia in other diseases classified elsewhere without behavioral disturbance: Secondary | ICD-10-CM

## 2013-06-05 DIAGNOSIS — M545 Low back pain, unspecified: Secondary | ICD-10-CM

## 2013-06-05 DIAGNOSIS — K5900 Constipation, unspecified: Secondary | ICD-10-CM

## 2013-06-05 DIAGNOSIS — K219 Gastro-esophageal reflux disease without esophagitis: Secondary | ICD-10-CM

## 2013-06-05 DIAGNOSIS — G309 Alzheimer's disease, unspecified: Secondary | ICD-10-CM

## 2013-06-05 DIAGNOSIS — K5909 Other constipation: Secondary | ICD-10-CM

## 2013-06-05 NOTE — Progress Notes (Signed)
Patient ID: [name], female   DOB: 1919/05/18, 79 y.o.   MRN: [id number] Location:  [facility] Provider:  Gwenith Spitz. Renato Gails, D.O., C.M.D.  Code Status: DNR  Chief Complaint: medical mgt of chronic diseases HPI:  79 yo female with h/o Alzheimer's disease, chronic low back pain, constipation, HTN, GERD, senile osteoporosis was seen for medical mgt of chronic diseases.  She was very pleasant today and had no complaints.  Review of Systems:  Review of Systems  Constitutional: Negative for fever and chills.  HENT: Negative for congestion.   Eyes: Negative for blurred vision.  Respiratory: Negative for cough and shortness of breath.   Cardiovascular: Negative for chest pain.  Gastrointestinal: Negative for abdominal pain.  Genitourinary: Negative for dysuria.  Musculoskeletal: Negative for back pain and falls.  Skin: Negative for rash.  Neurological: Negative for sensory change.  Endo/Heme/Allergies: Does not bruise/bleed easily.  Psychiatric/Behavioral: Positive for memory loss.     Medications: Patient's Medications  New Prescriptions   No medications on file  Previous Medications   CALCIUM CARB-CHOLECALCIFEROL (CALCIUM + D3) 600-200 MG-UNIT TABS    Take 1 tablet by mouth daily. Take 1 tablet by mouth daily for calcium deficiency.   DIVALPROEX (DEPAKOTE) 125 MG DR TABLET    Take 125 mg by mouth every morning.   DIVALPROEX (DEPAKOTE) 250 MG DR TABLET    Take 250 mg by mouth at bedtime.   DOCUSATE SODIUM (COLACE) 100 MG CAPSULE    Take 100 mg by mouth 2 (two) times daily. Take 1 tablet by mouth twice daily for constipation.   HYDROCODONE-ACETAMINOPHEN (NORCO/VICODIN) 5-325 MG PER TABLET    Take one tablet by mouth four times daily, not to exceed 3000mg  of APAP   LIDOCAINE (LIDODERM) 5 %    Place 1 patch onto the skin daily. Apply to lower back topically one time daily for back pain,place on in the am and off in the pm.   LORAZEPAM (ATIVAN) 0.5 MG TABLET    Take  0.5 mg by mouth every 8 (eight) hours as needed for anxiety.   METOPROLOL TARTRATE (LOPRESSOR) 25 MG TABLET    Take 25 mg by mouth 2 (two) times daily. Give 25 mg tablet  by mouth twice daily for hypertension.   POLYETHYLENE GLYCOL (MIRALAX / GLYCOLAX) PACKET    Take 17 g by mouth daily. Mix 17 gms with 8 oz of liquid of choice for constipation.   SERTRALINE (ZOLOFT) 50 MG TABLET    Take 50 mg by mouth daily.  Modified Medications   No medications on file  Discontinued Medications   No medications on file    Physical Exam: Filed Vitals:   12/06/13 1128  BP: 98/50  Pulse: 72  Temp: 97.7 F (36.5 C)  Resp: 20  SpO2: 97%   Physical Exam  Constitutional: She appears well-developed and well-nourished. No distress.  HENT:  Head: Normocephalic and atraumatic.  Cardiovascular: Normal rate, regular rhythm, normal heart sounds and intact distal pulses.   Pulmonary/Chest: Effort normal and breath sounds normal. No respiratory distress.  Abdominal: Soft. Bowel sounds are normal. She exhibits no distension and no mass. There is no tenderness.  Musculoskeletal:  [name] herself around facility in wheelchair  Skin: Skin is warm and dry.  Psychiatric: She has a normal mood and affect.  Pleasant and talkative today, no acute distress   Assessment/Plan 1. Alzheimer's disease -with some frontal lobe dysfunction and sexual inhibition at times -no longer on aricept and namenda--dependent  in adls except feeding  2. Low back pain -controlled fairly well with lidoderm patch and vicodin  3. Senile osteoporosis -cont ca with D, does not ambulate and does not sit up regularly  4. Chronic constipation -cont miralax daily which has been effective  5. Essential hypertension, benign -bp at goal with lopressor   6. GERD (gastroesophageal reflux disease) -no recent c/o symptoms and off medications for this at this time  Goals of care: DNR  Labs/tests ordered:  Cbc, bmp, vitamin D

## 2013-06-20 ENCOUNTER — Encounter: Payer: Self-pay | Admitting: *Deleted

## 2013-06-21 ENCOUNTER — Encounter: Payer: Self-pay | Admitting: *Deleted

## 2013-06-28 ENCOUNTER — Non-Acute Institutional Stay (SKILLED_NURSING_FACILITY): Payer: Medicare Other | Admitting: Adult Health

## 2013-06-28 DIAGNOSIS — K5900 Constipation, unspecified: Secondary | ICD-10-CM

## 2013-06-28 DIAGNOSIS — F0280 Dementia in other diseases classified elsewhere without behavioral disturbance: Secondary | ICD-10-CM

## 2013-06-28 DIAGNOSIS — I10 Essential (primary) hypertension: Secondary | ICD-10-CM

## 2013-06-28 DIAGNOSIS — N390 Urinary tract infection, site not specified: Secondary | ICD-10-CM

## 2013-06-28 DIAGNOSIS — G309 Alzheimer's disease, unspecified: Secondary | ICD-10-CM

## 2013-06-28 DIAGNOSIS — M545 Low back pain, unspecified: Secondary | ICD-10-CM

## 2013-06-29 ENCOUNTER — Encounter: Payer: Self-pay | Admitting: Internal Medicine

## 2013-06-29 DIAGNOSIS — M810 Age-related osteoporosis without current pathological fracture: Secondary | ICD-10-CM | POA: Insufficient documentation

## 2013-07-03 ENCOUNTER — Non-Acute Institutional Stay (SKILLED_NURSING_FACILITY): Payer: Medicare Other | Admitting: Internal Medicine

## 2013-07-03 DIAGNOSIS — N390 Urinary tract infection, site not specified: Secondary | ICD-10-CM

## 2013-07-03 DIAGNOSIS — F6589 Other paraphilias: Secondary | ICD-10-CM

## 2013-07-03 DIAGNOSIS — F0280 Dementia in other diseases classified elsewhere without behavioral disturbance: Secondary | ICD-10-CM

## 2013-07-03 DIAGNOSIS — F528 Other sexual dysfunction not due to a substance or known physiological condition: Secondary | ICD-10-CM

## 2013-07-03 DIAGNOSIS — G309 Alzheimer's disease, unspecified: Secondary | ICD-10-CM

## 2013-07-03 DIAGNOSIS — I10 Essential (primary) hypertension: Secondary | ICD-10-CM
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
## 2013-08-07 ENCOUNTER — Encounter: Payer: Self-pay | Admitting: Family

## 2013-08-07 ENCOUNTER — Non-Acute Institutional Stay (SKILLED_NURSING_FACILITY): Payer: Medicare Other | Admitting: Family

## 2013-08-07 DIAGNOSIS — M810 Age-related osteoporosis without current pathological fracture: Secondary | ICD-10-CM

## 2013-08-07 DIAGNOSIS — F411 Generalized anxiety disorder: Secondary | ICD-10-CM

## 2013-08-07 DIAGNOSIS — F39 Unspecified mood [affective] disorder: Secondary | ICD-10-CM

## 2013-08-07 DIAGNOSIS — I10 Essential (primary) hypertension: Secondary | ICD-10-CM

## 2013-08-07 DIAGNOSIS — M545 Low back pain: Secondary | ICD-10-CM

## 2013-08-07 DIAGNOSIS — F0280 Dementia in other diseases classified elsewhere without behavioral disturbance: Secondary | ICD-10-CM

## 2013-08-07 NOTE — Progress Notes (Signed)
Patient ID: [name], female   DOB: 23-Sep-1919, 79 y.o.   MRN: [id number]  08/07/2013   Facility: [facility]   Code Status:  DNR  Chief Complaint  Patient presents with  . Medical Managment of Chronic Issues    HPI Pt is being followed for the medical management of chronic illnesses. Pt denies constitutional s/s of  N/V/D/fever, chills, and malaise. Pt reports intermittent generalized weakness over the course of 3 months.  Pt reports the weakness is relieved by rest and relaxation. Pt is currently receiving Depakote, Zoloft, and Ativan to treat anxiety, mood disorder, and hypersexual behavior. Nursing staff reports patient's behavior as appropriate. Nursing staff further endorses that pt is adherent with current treatment plan.Pt and nursing staff denies further concerns and issues at present.      Allergies  Allergen Reactions  . Sulfa Antibiotics      Medication List       This list is accurate as of: 08/07/13 12:39 PM.  Always use your most recent med list.               Calcium + D3 600-200 MG-UNIT Tabs  Take 1 tablet by mouth daily. Take 1 tablet by mouth daily for calcium deficiency.     divalproex 250 MG DR tablet  Commonly known as:  DEPAKOTE  Take 250 mg by mouth at bedtime.     divalproex 125 MG DR tablet  Commonly known as:  DEPAKOTE  Take 125 mg by mouth every morning.     docusate sodium 100 MG capsule  Commonly known as:  COLACE  Take 100 mg by mouth 2 (two) times daily. Take 1 tablet by mouth twice daily for constipation.     HYDROcodone-acetaminophen 5-325 MG per tablet  Commonly known as:  NORCO/VICODIN  Take one tablet by mouth four times daily, not to exceed 3000mg  of APAP     LIDODERM 5 %  Generic drug:  lidocaine  Place 1 patch onto the skin daily. Apply to lower back topically one time daily for back pain,place on in the am and off in the pm.     LORazepam 0.5 MG tablet  Commonly known as:  ATIVAN  Take 0.5 mg by mouth every 8  (eight) hours as needed for anxiety.     metoprolol tartrate 25 MG tablet  Commonly known as:  LOPRESSOR  Take 25 mg by mouth 2 (two) times daily. Give 25 mg tablet  by mouth twice daily for hypertension.     polyethylene glycol packet  Commonly known as:  MIRALAX / GLYCOLAX  Take 17 g by mouth daily. Mix 17 gms with 8 oz of liquid of choice for constipation.     sertraline 50 MG tablet  Commonly known as:  ZOLOFT  Take 50 mg by mouth daily.         DATA REVIEWED   Laboratory Studies: 07/12/13- Na+ 138, K+ 3.8, Cl-105, Glucose 79, BUN 10, Creatinine0.69, Albumin 3.1, Calcium 9.2, WBC 6.8, RBC 4.11, Hemoglobin 12.4, Hematocrit 36.7, Platlet 227, TSH 1.788     Past Medical History  Diagnosis Date  . Anxiety   . Anemia   . Hypertension   . Arthritis   . Osteoporosis   . Weight loss   . Alzheimer's disease   . Reflux esophagitis   . Diverticulosis of colon (without mention of hemorrhage)   . Unspecified constipation   . Hemorrhage of gastrointestinal tract, unspecified   . Lumbago  Past Surgical History  Procedure Laterality Date  . Left interochanteric orif  01/01/2011  . Attempted sigmoidoscopy  12/22/2010      History   Social History  . Marital Status: Widowed    Spouse Name: N/A    Number of Children: N/A  . Years of Education: N/A   Occupational History  . Not on file.   Social History Main Topics  . Smoking status: Never Smoker   . Smokeless tobacco: Not on file  . Alcohol Use: No  . Drug Use: No  . Sexual Activity: Not on file   Other Topics Concern  . Not on file   Social History Narrative  . No narrative on file   Historians: Pt and nursing staff Pt. Reliability 4/5 Nursing Staff Reliability 5/5  Review of Systems  Constitutional: Negative.   HENT: Negative for neck pain.        Missing teeth/ No dentures  Eyes: Negative.   Respiratory: Positive for cough. Negative for hemoptysis, sputum production, shortness of breath and  wheezing.        Non-productive cough occassionally  Cardiovascular: Negative.   Gastrointestinal: Negative.   Genitourinary: Positive for urgency and frequency.  Musculoskeletal: Positive for joint pain. Negative for back pain and falls.       Wrist and Knee pain  Neurological: Positive for dizziness and focal weakness. Negative for tingling and tremors.       Intermittent with rapid position change  Endo/Heme/Allergies: Negative.      Physical Exam Filed Vitals:   08/07/13 1223  BP: 124/84  Pulse: 79  Temp: 97.7 F (36.5 C)  Resp: 18    Physical Exam  Constitutional: She appears well-developed and well-nourished. No distress.  HENT:  Head: Normocephalic.  Mouth/Throat: Oropharynx is clear and moist. Normal dentition.  Missing teeth  Eyes: Conjunctivae and EOM are normal. Pupils are equal, round, and reactive to light.  Neck: Normal range of motion. No JVD present. No tracheal deviation present. No thyromegaly present.  Cardiovascular: Normal rate and regular rhythm.   Pulses:      Radial pulses are 2+ on the right side, and 2+ on the left side.  Pulmonary/Chest: Effort normal.  Abdominal: Soft. Bowel sounds are normal. There is no rebound, no guarding and no CVA tenderness.  Genitourinary:  Incontinent Brief intact  Musculoskeletal:       Right shoulder: She exhibits decreased range of motion. She exhibits no tenderness and no swelling.       Right elbow: She exhibits decreased range of motion.       Right wrist: She exhibits decreased range of motion. She exhibits no tenderness and no swelling.       Right knee: She exhibits decreased range of motion. She exhibits no swelling, no ecchymosis and no erythema.  Lymphadenopathy:    She has no cervical adenopathy.  Neurological: She is alert. GCS eye subscore is 4. GCS verbal subscore is 5. GCS motor subscore is 5.  Skin: Skin is warm and dry.  Rapid recoil with skin turgor test  Psychiatric: She has a normal mood and  affect. Her speech is normal and behavior is normal. Cognition and memory are impaired.  Short term memory deficits exhibited; disoriented to place and time    ASSESSMENT/PLAN    Alzheimer's disease End stages, palliative care at this point, off memory meds, oriented to self only, dependent in adls  Mood Disorder Pt started on Depakote to stabilize mood. Will monitor valproic acid level  in one week.   Low back pain Chronic pain mitigated with lidoderm patch and oral analgesics. Will continue with treatment  Anxiety Pt started on Ativan. Will continue to monitor effectiveness of therapy.       Follow up: In one week regarding valproic acid level-for steady state

## 2013-09-30 ENCOUNTER — Non-Acute Institutional Stay (SKILLED_NURSING_FACILITY): Payer: Medicare Other | Admitting: Nurse Practitioner

## 2013-09-30 DIAGNOSIS — K5900 Constipation, unspecified: Secondary | ICD-10-CM

## 2013-09-30 DIAGNOSIS — F0280 Dementia in other diseases classified elsewhere without behavioral disturbance: Secondary | ICD-10-CM

## 2013-09-30 DIAGNOSIS — E559 Vitamin D deficiency, unspecified: Secondary | ICD-10-CM

## 2013-09-30 DIAGNOSIS — I10 Essential (primary) hypertension: Secondary | ICD-10-CM

## 2013-09-30 DIAGNOSIS — F411 Generalized anxiety disorder: Secondary | ICD-10-CM

## 2013-09-30 NOTE — Progress Notes (Signed)
Patient ID: [name], female   DOB: 02-24-1919, 79 y.o.   MRN: [id number] Nursing Home Location:  [facility]   Place of Service: SNF (31)  PCP: REED, TIFFANY, DO   Allergies  Allergen Reactions  . Sulfa Antibiotics     Chief Complaint  Patient presents with  . Medical Managment of Chronic Issues    HPI:  Pt is being seen today for the medical management of chronic illnesses. Pt with pmh of dementia, anxiety, pain, GERD, OA, constipation;  Pt is currently receiving Depakote, Zoloft, and Ativan to treat anxiety, mood disorder, and hypersexual behavior. Nursing staff reports patient's behavior are appropriate at this time; pt will still have anxiety at time but is easily redirected. Pt and nursing staff denies further concerns and issues at this time.   Review of Systems:  Review of Systems  Constitutional: Negative.   HENT:       Missing teeth/ No dentures  Eyes: Negative.   Respiratory: Negative for cough and shortness of breath.   Cardiovascular: Negative for chest pain and leg swelling.  Gastrointestinal: Negative.  Negative for heartburn, abdominal pain and constipation.  Genitourinary: Negative for dysuria.  Musculoskeletal: Negative for back pain, falls and joint pain.  Skin: Negative.   Neurological: Negative for dizziness.  Endo/Heme/Allergies: Negative.   Psychiatric/Behavioral: Positive for memory loss. Negative for depression.     Past Medical History  Diagnosis Date  . Anxiety   . Anemia   . Hypertension   . Arthritis   . Osteoporosis   . Weight loss   . Alzheimer's disease   . Reflux esophagitis   . Diverticulosis of colon (without mention of hemorrhage)   . Unspecified constipation   . Hemorrhage of gastrointestinal tract, unspecified   . Lumbago    Past Surgical History  Procedure Laterality Date  . Left interochanteric orif  01/01/2011  . Attempted sigmoidoscopy  12/22/2010   Social History:   reports that she has  never smoked. She does not have any smokeless tobacco history on file. She reports that she does not drink alcohol or use illicit drugs.  No family history on file.  Medications: Patient's Medications  New Prescriptions   No medications on file  Previous Medications   CALCIUM CARB-CHOLECALCIFEROL (CALCIUM + D3) 600-200 MG-UNIT TABS    Take 1 tablet by mouth daily. Take 1 tablet by mouth daily for calcium deficiency.   DIVALPROEX (DEPAKOTE) 125 MG DR TABLET    Take 125 mg by mouth every morning.   DIVALPROEX (DEPAKOTE) 250 MG DR TABLET    Take 250 mg by mouth at bedtime.   DOCUSATE SODIUM (COLACE) 100 MG CAPSULE    Take 100 mg by mouth 2 (two) times daily. Take 1 tablet by mouth twice daily for constipation.   HYDROCODONE-ACETAMINOPHEN (NORCO/VICODIN) 5-325 MG PER TABLET    Take one tablet by mouth four times daily, not to exceed 3000mg  of APAP   LIDOCAINE (LIDODERM) 5 %    Place 1 patch onto the skin daily. Apply to lower back topically one time daily for back pain,place on in the am and off in the pm.   LORAZEPAM (ATIVAN) 0.5 MG TABLET    Take 0.5 mg by mouth every 8 (eight) hours as needed for anxiety.   METOPROLOL TARTRATE (LOPRESSOR) 25 MG TABLET    Take 25 mg by mouth 2 (two) times daily. Give 25 mg tablet  by mouth twice daily for hypertension.   POLYETHYLENE GLYCOL (MIRALAX /  GLYCOLAX) PACKET    Take 17 g by mouth daily. Mix 17 gms with 8 oz of liquid of choice for constipation.   SERTRALINE (ZOLOFT) 50 MG TABLET    Take 50 mg by mouth daily.  Modified Medications   No medications on file  Discontinued Medications   No medications on file     Physical Exam:  Filed Vitals:   09/30/13 1255  BP: 119/56  Pulse: 69  Temp: 98.1 F (36.7 C)  Resp: 20   Physical Exam  Constitutional: No distress.  HENT:  Head: Normocephalic and atraumatic.  Mouth/Throat: Oropharynx is clear and moist. No oropharyngeal exudate.  Eyes: Conjunctivae and EOM are normal. Pupils are equal, round,  and reactive to light.  Neck: Normal range of motion. Neck supple.  Cardiovascular: Normal rate and regular rhythm.   Pulmonary/Chest: Effort normal and breath sounds normal. No respiratory distress.  Abdominal: Soft. Bowel sounds are normal. She exhibits no distension.  Musculoskeletal: She exhibits no edema and no tenderness.  Neurological: She is alert.  Skin: Skin is warm and dry. She is not diaphoretic.      Labs reviewed: MP with Estimated GFR    Result: 06/10/2013 1:20 PM   ( Status: F )     C Sodium 140     135-145 mEq/L SLN   Potassium 3.9     3.5-5.3 mEq/L SLN   Chloride 102     96-112 mEq/L SLN   CO2 29     19-32 mEq/L SLN   Glucose 80     70-99 mg/dL SLN   BUN 14     4-09 mg/dL SLN   Creatinine 8.11     0.50-1.10 mg/dL SLN   Calcium 91.4     8.4-10.5 mg/dL SLN   Est GFR, African American 75      mL/min SLN   Est GFR, NonAfrican American 65      mL/min SLN C CBC with Diff    Result: 06/10/2013 12:03 PM   ( Status: F )       WBC 7.3     4.0-10.5 K/uL SLN   RBC 4.20     3.87-5.11 MIL/uL SLN   Hemoglobin 12.7     12.0-15.0 g/dL SLN   Hematocrit 78.2     36.0-46.0 % SLN   MCV 90.7     78.0-100.0 fL SLN   MCH 30.2     26.0-34.0 pg SLN   MCHC 33.3     30.0-36.0 g/dL SLN   RDW 95.6     21.3-08.6 % SLN   Platelet Count 290     150-400 K/uL SLN   Granulocyte % 66     43-77 % SLN   Absolute Gran 4.8     1.7-7.7 K/uL SLN   Lymph % 26     12-46 % SLN   Absolute Lymph 1.9     0.7-4.0 K/uL SLN   Mono % 6     3-12 % SLN   Absolute Mono 0.4     0.1-1.0 K/uL SLN   Eos % 2     0-5 % SLN   Absolute Eos 0.2     0.0-0.7 K/uL SLN   Baso % 0     0-1 % SLN   Absolute Baso 0.0     0.0-0.1 K/uL SLN   Smear Review Criteria for review not met  SLN   Vitamin D (25-Hydroxy)    Result: 06/10/2013 3:39 PM   ( Status:  F )       Vitamin D (25-Hydroxy) 28   L 30-89 ng/mL SL Liver Profile    Result: 09/09/2013 12:09 PM   ( Status: F )     C Bilirubin, Total 0.5     0.3-1.2 mg/dL SLN    Bilirubin, Direct 0.1     0.0-0.3 mg/dL SLN   Indirect Bilirubin 0.4     0.0-0.9 mg/dL SLN   Alkaline Phosphatase 47     39-117 U/L SLN   AST/SGOT 13     0-37 U/L SLN   ALT/SGPT <8     0-35 U/L SLN   Total Protein 6.4     6.0-8.3 g/dL SLN   Albumin 3.2   L 0.4-5.4 g/dL SLN   Valproic Acid (Depakene)    Result: 09/09/2013 11:04 AM   ( Status: F )       Valproic Acid (Depakene) 37.0   L 50.0-100.0   Assessment/Plan 1. Essential hypertension, benign Patient is stable; continue current regimen. Will monitor and make changes as necessary.  2.Vitamin D deficiency  Vit d def on labs in July; was not placed on vit d will order vid d 09811 units weekly for 6 weeks then to change to 1000 units daily; follow up vit d in 6 weeks  3. Unspecified constipation Stable at this time   4. Alzheimer's disease Behaviors have been stable; will cont current medications  5. Anxiety state, unspecified Stable on current medication; with some increase anxiety at time but overall has improved

## 2013-10-15 NOTE — Progress Notes (Signed)
This encounter was created in error - please disregard.

## 2013-10-15 NOTE — Progress Notes (Signed)
Patient ID: [name], female   DOB: 1919/05/02, 79 y.o.   MRN: [id number]  graphic   [facility]    Allergies   Allergen  Reactions   .  Sulfa Antibiotics         Chief Complaint   Patient presents with   .  Medical Managment of Chronic Issues     HPI  She is being seen for the management for her chronic illnesses. She is unable fully participate in the hpi or ros. There are no concerns being voiced by the nursing staff at this time. She has had a urine culture done this past week which as demonstrated e-coli and require treatment.     Past Medical History   Diagnosis  Date   .  Anxiety     .  Anemia     .  Hypertension     .  Arthritis     .  Osteoporosis     .  Weight loss     .  Alzheimer's disease     .  Reflux esophagitis     .  Diverticulosis of colon (without mention of hemorrhage)     .  Unspecified constipation     .  Hemorrhage of gastrointestinal tract, unspecified     .  Lumbago         Past Surgical History   Procedure  Laterality  Date   .  Left interochanteric orif    01/01/2011   .  Attempted sigmoidoscopy    12/22/2010       Filed Vitals:     06-28-2013  BP:  110/63   Pulse:  70   Height:  5\' 3"  (1.6 m)   Weight:  124 lb (56.246 kg)     MEDICATIONS Ca++ 500/200 daily Colace twice daily vicodin 5/325 mg every 6 hours as needed lidoderm patch to lower back Lopressor 25 mg twice daily miralax daily  LABS REVIEWED:    01-13-13: wbc 6.0; hgb 11.9; hct 25.5; mcv 90.1; plt 249; glucose 93; bun 15; creat 0.67; k+4.4 Na++141 01-15-13: urine culture: e-coli: ampicillin 06-28-13: urine culture: e-coli: augmentin  Review of Systems  Unable to perform ROS   Physical Exam  Constitutional:  frail  Neck: Neck supple.  Cardiovascular: Normal rate, regular rhythm and intact distal pulses.   Respiratory: Effort normal and breath sounds normal.  GI: Soft. Bowel sounds are normal.  Musculoskeletal:  Is able to move extremities  Neurological:  She is alert.  Oriented to self only  Skin: Skin is warm and dry.   ASSESSMENT/PLAN  1. Uti: will being augmentin 875 mg twice daily for one week with florastor twice daily for 2 weeks and will monitor  2. Alzheimer's disease: she is slowly declining; spending nearly all of her time in bed; less interactive with her surroundings; is not on medications; will continue to monitor her status   3. Hypertension: will continue lopressor 25 mg twice daily  4. Chronic low back pain: there are no indications of pain will continue lidoderm patch to lower back and vicodin 5/325 mg every 6 hours as needed.   5. Constipation: will continue miralax daily

## 2013-10-15 NOTE — Progress Notes (Signed)
Patient ID: [name], female   DOB: 1919-11-01, 79 y.o.   MRN: [id number]     [facility]  Allergies  Allergen Reactions  . Sulfa Antibiotics     Chief Complaint  Patient presents with  . Medical Managment of Chronic Issues    HPI  She is being seen for the management for her chronic illnesses. She is unable fully participate in the hpi or ros. There are no concerns being voiced by the nursing staff at this time. She has had a urine culture done this past week which as demonstrated e-coli and require treatment.   Past Medical History  Diagnosis Date  . Anxiety   . Anemia   . Hypertension   . Arthritis   . Osteoporosis   . Weight loss   . Alzheimer's disease   . Reflux esophagitis   . Diverticulosis of colon (without mention of hemorrhage)   . Unspecified constipation   . Hemorrhage of gastrointestinal tract, unspecified   . Lumbago     Past Surgical History  Procedure Laterality Date  . Left interochanteric orif  01/01/2011  . Attempted sigmoidoscopy  12/22/2010    Filed Vitals:   06/04/13 1154  BP: 110/63  Pulse: 70  Height: 5\' 3"  (1.6 m)  Weight: 124 lb (56.246 kg)    MEDICATIONS Ca++ 500/200 daily Colace twice daily vicodin 5/325 mg every 6 hours as needed lidoderm patch to lower back Lopressor 25 mg twice daily miralax daily  LABS REVIEWED:   01-13-13: wbc 6.0; hgb 11.9; hct 25.5; mcv 90.1; plt 249; glucose 93; bun 15; creat 0.67; k+4.4 Na++141 01-15-13: urine culture: e-coli: ampicillin 06-28-13: urine culture: e-coli: augmentin  Review of Systems  Unable to perform ROS   Physical Exam  Constitutional:  frail  Neck: Neck supple.  Cardiovascular: Normal rate, regular rhythm and intact distal pulses.   Respiratory: Effort normal and breath sounds normal.  GI: Soft. Bowel sounds are normal.  Musculoskeletal:  Is able to move extremities  Neurological: She is alert.  Oriented to self only  Skin: Skin is warm and dry.    ASSESSMENT/PLAN  1. Uti: will being augmentin 875 mg twice daily for one week with florastor twice daily for 2 weeks and will monitor  2. Alzheimer's disease: she is slowly declining; spending nearly all of her time in bed; less interactive with her surroundings; is not on medications; will continue to monitor her status   3. Hypertension: will continue lopressor 25 mg twice daily  4. Chronic low back pain: there are no indications of pain will continue lidoderm patch to lower back and vicodin 5/325 mg every 6 hours as needed.   5. Constipation: will continue miralax daily

## 2013-10-15 NOTE — Addendum Note (Signed)
Addended by: Sharee Holster on: 10/15/2013 12:14 PM   Modules accepted: Level of Service, SmartSet

## 2013-12-06 ENCOUNTER — Encounter: Payer: Self-pay | Admitting: Internal Medicine

## 2013-12-06 DIAGNOSIS — K5909 Other constipation: Secondary | ICD-10-CM | POA: Insufficient documentation

## 2013-12-06 DIAGNOSIS — M810 Age-related osteoporosis without current pathological fracture: Secondary | ICD-10-CM | POA: Insufficient documentation

## 2013-12-18 ENCOUNTER — Non-Acute Institutional Stay (SKILLED_NURSING_FACILITY): Payer: Medicare Other | Admitting: Internal Medicine

## 2013-12-18 DIAGNOSIS — F0280 Dementia in other diseases classified elsewhere without behavioral disturbance: Secondary | ICD-10-CM

## 2013-12-18 DIAGNOSIS — M810 Age-related osteoporosis without current pathological fracture: Secondary | ICD-10-CM

## 2013-12-18 DIAGNOSIS — E559 Vitamin D deficiency, unspecified: Secondary | ICD-10-CM

## 2013-12-18 DIAGNOSIS — F6589 Other paraphilias: Secondary | ICD-10-CM

## 2013-12-18 DIAGNOSIS — I10 Essential (primary) hypertension: Secondary | ICD-10-CM

## 2013-12-18 DIAGNOSIS — G309 Alzheimer's disease, unspecified: Secondary | ICD-10-CM

## 2013-12-18 DIAGNOSIS — F528 Other sexual dysfunction not due to a substance or known physiological condition: Secondary | ICD-10-CM

## 2013-12-18 DIAGNOSIS — K5900 Constipation, unspecified: Secondary | ICD-10-CM

## 2013-12-18 NOTE — Progress Notes (Signed)
Patient ID: [name], female   DOB: 12-08-1918, 80 y.o.   MRN: [id number]  Location:  [facility] Provider:  Rexene Edison. Mariea Clonts, D.O., C.M.D.  Code Status:  DNR  Chief Complaint  Patient presents with  . Medical Management of Chronic Issues    HPI:  80 yo female with Alzheimer's disease, htn, depression, and some frontal lobe features, chronic oa seen for medical mgt of chronic diseases.   No difficulties right now with being overly sexually explicit with other residents.  Denies any concerns whatsoever today.  Review of Systems:  Review of Systems  Constitutional: Negative for fever and chills.  HENT: Negative for congestion.   Eyes: Negative for blurred vision.  Respiratory: Negative for shortness of breath.   Cardiovascular: Negative for chest pain.  Gastrointestinal: Negative for constipation.  Genitourinary: Negative for dysuria.  Musculoskeletal: Negative for falls and myalgias.  Skin: Negative for rash.  Neurological: Negative for dizziness and loss of consciousness.  Endo/Heme/Allergies: Bruises/bleeds easily.  Psychiatric/Behavioral: Positive for memory loss.    Medications: Patient's Medications  New Prescriptions   No medications on file  Previous Medications   CALCIUM CARB-CHOLECALCIFEROL (CALCIUM + D3) 600-200 MG-UNIT TABS    Take 1 tablet by mouth daily. Take 1 tablet by mouth daily for calcium deficiency.   CHOLECALCIFEROL (VITAMIN D) 1000 UNITS TABLET    Take 1,000 Units by mouth daily.   DIVALPROEX (DEPAKOTE) 125 MG DR TABLET    Take 125 mg by mouth every morning.   DIVALPROEX (DEPAKOTE) 250 MG DR TABLET    Take 250 mg by mouth at bedtime.   DOCUSATE SODIUM (COLACE) 100 MG CAPSULE    Take 100 mg by mouth 2 (two) times daily. Take 1 tablet by mouth twice daily for constipation.   LIDOCAINE (LIDODERM) 5 %    Place 1 patch onto the skin daily. Apply to lower back topically one time daily for back pain,place on in the am and off in the pm.   METOPROLOL TARTRATE (LOPRESSOR) 25 MG TABLET    Take 25 mg by mouth 2 (two) times daily. Give 25 mg tablet  by mouth twice daily for hypertension.   POLYETHYLENE GLYCOL (MIRALAX / GLYCOLAX) PACKET    Take 17 g by mouth daily. Mix 17 gms with 8 oz of liquid of choice for constipation.   SERTRALINE (ZOLOFT) 50 MG TABLET    Take 50 mg by mouth daily.  Modified Medications   Modified Medication Previous Medication   HYDROCODONE-ACETAMINOPHEN (NORCO/VICODIN) 5-325 MG PER TABLET HYDROcodone-acetaminophen (NORCO/VICODIN) 5-325 MG per tablet      Take one tablet by mouth every 6 hours as needed for pain    Take one tablet by mouth every 6 hours as needed for pain   LORAZEPAM (ATIVAN) 0.5 MG TABLET LORazepam (ATIVAN) 0.5 MG tablet      Take one tablet by mouth every 8 hours as needed for anxiety    Take 0.5 mg by mouth every 8 (eight) hours as needed for anxiety.  Discontinued Medications   HYDROCODONE-ACETAMINOPHEN (NORCO/VICODIN) 5-325 MG PER TABLET    Take one tablet by mouth four times daily, not to exceed 3000mg  of APAP    Physical Exam: Filed Vitals:   12/18/13 0838  BP: 130/78  Pulse: 68  Temp: 98.6 F (37 C)  Resp: 18  Height: 5\' 3"  (1.6 m)  Weight: 135 lb (61.236 kg)  SpO2: 97%   Physical Exam  Constitutional: She appears well-developed and well-nourished. No distress.  Cardiovascular: Normal rate, regular rhythm, normal heart sounds and intact distal pulses.   No murmur heard. Pulmonary/Chest: Effort normal and breath sounds normal. No respiratory distress.  Abdominal: Soft. Bowel sounds are normal. She exhibits no distension and no mass. There is no tenderness.  Musculoskeletal: Normal range of motion. She exhibits no edema and no tenderness.  Neurological: She is alert.  Oriented to person only  Skin: Skin is warm and dry.  Psychiatric: She has a normal mood and affect.     Labs reviewed: 11/12/13:  Vit D 31.7 11/19/13:  Influenza A, B, H1/N1 negative; wbc 7.5, h/h  13.4/41.4, plt 180, glucose 68, BUN 14, cr 0.71, Na 139, K 4.4  Assessment/Plan 1. Essential hypertension, benign -bp at goal with lopressor only  2. Unspecified constipation -controlled with miralax and colace  3. Alzheimer's disease -not on meds for this specifically only for her hypersexual state  4. Osteoporosis -cont ca with D  5. Unspecified vitamin D deficiency -cont ca with D, last level still insufficient  6. Hypersexuality state -cont depakote, zoloft   Family/ staff Communication: discussed with nursing staff Goals of care: long term care, comfort measures, DNR  Labs/tests ordered:  none

## 2013-12-19 ENCOUNTER — Other Ambulatory Visit: Payer: Self-pay | Admitting: *Deleted

## 2013-12-19 MED ORDER — HYDROCODONE-ACETAMINOPHEN 5-325 MG PO TABS: ORAL_TABLET | ORAL | Status: DC

## 2013-12-20 ENCOUNTER — Encounter: Payer: Self-pay | Admitting: Internal Medicine

## 2014-01-29 ENCOUNTER — Other Ambulatory Visit: Payer: Self-pay | Admitting: *Deleted

## 2014-01-29 MED ORDER — LORAZEPAM 0.5 MG PO TABS: ORAL_TABLET | ORAL | Status: AC

## 2014-01-29 NOTE — Telephone Encounter (Signed)
[facility] [place] 

## 2014-02-11 ENCOUNTER — Other Ambulatory Visit: Payer: Self-pay | Admitting: *Deleted

## 2014-02-11 MED ORDER — HYDROCODONE-ACETAMINOPHEN 5-325 MG PO TABS: ORAL_TABLET | ORAL | Status: DC

## 2014-02-11 NOTE — Telephone Encounter (Signed)
[facility] [place] 

## 2014-02-12 ENCOUNTER — Non-Acute Institutional Stay (SKILLED_NURSING_FACILITY): Payer: Medicare Other | Admitting: Internal Medicine

## 2014-02-12 ENCOUNTER — Encounter: Payer: Self-pay | Admitting: Internal Medicine

## 2014-02-12 DIAGNOSIS — M810 Age-related osteoporosis without current pathological fracture: Secondary | ICD-10-CM

## 2014-02-12 DIAGNOSIS — G309 Alzheimer's disease, unspecified: Secondary | ICD-10-CM

## 2014-02-12 DIAGNOSIS — K5900 Constipation, unspecified: Secondary | ICD-10-CM

## 2014-02-12 DIAGNOSIS — I10 Essential (primary) hypertension: Secondary | ICD-10-CM

## 2014-02-12 DIAGNOSIS — K5909 Other constipation: Secondary | ICD-10-CM

## 2014-02-12 DIAGNOSIS — F0280 Dementia in other diseases classified elsewhere without behavioral disturbance: Secondary | ICD-10-CM

## 2014-02-12 NOTE — Progress Notes (Signed)
Patient ID: [name], female   DOB: 1919/08/21, 80 y.o.   MRN: [id number]  Provider:  Rexene Edison. Mariea Clonts, D.O., C.M.D.  Location:  [facility]  PCP: Hollace Kinnier, DO  Code Status: DNR  Allergies  Allergen Reactions  . Sulfa Antibiotics     Chief Complaint  Patient presents with  . Medical Managment of Chronic Issues    HPI: 80 y.o. female with a history of HTN, constipation, Alzheimer's disease, osteoporosis, and dementia who is being seen today for routine follow-up of chronic illnesses.  Today she is complaining of mild abdominal discomfort.  Onset and duration is unknown, nothing makes it worse or better, symptoms are mild in severity.  She is pleasantly confused and states that "I'm very happy today, and feel great."   ROS: Review of Systems  Constitutional: Negative for fever, chills and malaise/fatigue.  HENT: Negative for congestion.   Respiratory: Negative for cough, shortness of breath and wheezing.   Cardiovascular: Negative for chest pain, palpitations and leg swelling.  Gastrointestinal: Positive for abdominal pain. Negative for heartburn, nausea, vomiting, diarrhea and constipation.  Genitourinary: Negative for dysuria.  Musculoskeletal: Negative for myalgias.  Skin: Negative for itching and rash.  Neurological: Negative for dizziness, weakness and headaches.  Psychiatric/Behavioral: Negative for depression. The patient is not nervous/anxious and does not have insomnia.     Past Medical History  Diagnosis Date  . Anxiety   . Anemia   . Hypertension   . Arthritis   . Osteoporosis   . Weight loss   . Alzheimer's disease   . Reflux esophagitis   . Diverticulosis of colon (without mention of hemorrhage)   . Unspecified constipation   . Hemorrhage of gastrointestinal tract, unspecified   . Lumbago    Past Surgical History  Procedure Laterality Date  . Left interochanteric orif  01/01/2011  . Attempted sigmoidoscopy  12/22/2010    Social History:   reports that she has never smoked. She does not have any smokeless tobacco history on file. She reports that she does not drink alcohol or use illicit drugs.  No family history on file.  Medications: Patient's Medications  New Prescriptions   No medications on file  Previous Medications   CALCIUM CARB-CHOLECALCIFEROL (CALCIUM + D3) 600-200 MG-UNIT TABS    Take 1 tablet by mouth daily. Take 1 tablet by mouth daily for calcium deficiency.   CHOLECALCIFEROL (VITAMIN D) 1000 UNITS TABLET    Take 1,000 Units by mouth daily.   DIVALPROEX (DEPAKOTE) 125 MG DR TABLET    Take 125 mg by mouth every morning.   DIVALPROEX (DEPAKOTE) 250 MG DR TABLET    Take 250 mg by mouth at bedtime.   DOCUSATE SODIUM (COLACE) 100 MG CAPSULE    Take 100 mg by mouth 2 (two) times daily. Take 1 tablet by mouth twice daily for constipation.   HYDROCODONE-ACETAMINOPHEN (NORCO/VICODIN) 5-325 MG PER TABLET    Take one tablet by mouth every 6 hours as needed for pain   LIDOCAINE (LIDODERM) 5 %    Place 1 patch onto the skin daily. Apply to lower back topically one time daily for back pain,place on in the am and off in the pm.   LORAZEPAM (ATIVAN) 0.5 MG TABLET    Take one tablet by mouth every 8 hours as needed for anxiety   METOPROLOL TARTRATE (LOPRESSOR) 25 MG TABLET    Take 25 mg by mouth 2 (two) times daily. Give 25 mg tablet  by mouth  twice daily for hypertension.   POLYETHYLENE GLYCOL (MIRALAX / GLYCOLAX) PACKET    Take 17 g by mouth daily. Mix 17 gms with 8 oz of liquid of choice for constipation.   SERTRALINE (ZOLOFT) 50 MG TABLET    Take 50 mg by mouth daily.  Modified Medications   No medications on file  Discontinued Medications   No medications on file     Physical Exam: Filed Vitals:   02/12/14 1206  BP: 116/60  Pulse: 67  Temp: 96.9 F (36.1 C)  Resp: 16  SpO2: 98%   Physical Exam  Nursing note and vitals reviewed. Constitutional: She appears well-developed and  well-nourished. No distress.  HENT:  Head: Normocephalic.  Mouth/Throat: No oropharyngeal exudate.  Eyes: Pupils are equal, round, and reactive to light. Right eye exhibits no discharge. Left eye exhibits no discharge.  Neck: Normal range of motion.  Cardiovascular: Normal rate, regular rhythm, normal heart sounds and intact distal pulses.  Exam reveals no gallop and no friction rub.   No murmur heard. Pulmonary/Chest: Effort normal and breath sounds normal. No respiratory distress. She has no wheezes. She has no rales.  Abdominal: Soft. Bowel sounds are normal. She exhibits no distension and no mass. There is no tenderness. There is no rebound and no guarding.  Musculoskeletal: She exhibits no edema and no tenderness.  Neurological: She is alert. She exhibits normal muscle tone.  Oriented to person, pleasantly confused  Skin: Skin is warm and dry. No rash noted. She is not diaphoretic. No erythema. No pallor.  Psychiatric:  Mild euphoria, pleasant, cooperative   No recent labs to review   Assessment/Plan Problem List Items Addressed This Visit     Cardiovascular and Mediastinum   Essential hypertension, benign - Primary     -Controlled, continue Metoprolol as prescribed.       Digestive   Chronic constipation     -c/o abdominal discomfort today, exam WNL -Having regular BM's per staff, no diarrhea or constipation -Continue bowel regimen of Miralax and Colace -CBC ordered today to check for anemia      Nervous and Auditory   Alzheimer's disease     -Stable, continue Depakote, Lorazepam PRN, and Zoloft. -Last Depakote level stable at 37 (09/09/2013) -Depakote level ordered today      Musculoskeletal and Integument   Osteoporosis     -Last Vitamin D level 31.7 (11/12/2013) -Vitamin D level ordered today -Continue Calcium-carbonate-Vitamin D, and Cholecalciferol    Relevant Medications      cholecalciferol (VITAMIN D) 1000 UNITS tablet     Functional status: Able  to stand with assist, and pivot to chair. Feeds self independently Family/ staff Communication:  RN states patient is having regular bowel movements, no diarrhea or constipation, and no skin breakdown; and mental status stable.   Labs/tests ordered: CBC, CMP, Valproic Acid level, Vitamin D level

## 2014-02-12 NOTE — Assessment & Plan Note (Signed)
-  Last Vitamin D level 31.7 (11/12/2013) -Vitamin D level ordered today -Continue Calcium-carbonate-Vitamin D, and Cholecalciferol

## 2014-02-12 NOTE — Assessment & Plan Note (Signed)
-  c/o abdominal discomfort today, exam WNL -Having regular BM's per staff, no diarrhea or constipation -Continue bowel regimen of Miralax and Colace -CBC ordered today to check for anemia

## 2014-02-12 NOTE — Assessment & Plan Note (Addendum)
-  Stable, continue Depakote, Lorazepam PRN, and Zoloft. -Last Depakote level stable at 37 (09/09/2013) -Depakote level ordered today

## 2014-02-12 NOTE — Assessment & Plan Note (Addendum)
-  Controlled, continue Metoprolol as prescribed.

## 2014-02-13 LAB — BASIC METABOLIC PANEL
BUN: 13 mg/dL (ref 4–21)
GLUCOSE: 72 mg/dL
POTASSIUM: 4.3 mmol/L (ref 3.4–5.3)
SODIUM: 140 mmol/L (ref 137–147)

## 2014-03-25 LAB — HEPATIC FUNCTION PANEL
ALT: 5 U/L — AB (ref 7–35)
AST: 14 U/L (ref 13–35)
Alkaline Phosphatase: 48 U/L (ref 25–125)
Bilirubin, Total: 0.3 mg/dL

## 2014-04-16 ENCOUNTER — Encounter: Payer: Self-pay | Admitting: Internal Medicine

## 2014-04-16 ENCOUNTER — Non-Acute Institutional Stay (SKILLED_NURSING_FACILITY): Payer: Medicare Other | Admitting: Internal Medicine

## 2014-04-16 DIAGNOSIS — M810 Age-related osteoporosis without current pathological fracture: Secondary | ICD-10-CM

## 2014-04-16 DIAGNOSIS — R110 Nausea: Secondary | ICD-10-CM

## 2014-04-16 DIAGNOSIS — K5909 Other constipation: Secondary | ICD-10-CM

## 2014-04-16 DIAGNOSIS — K5900 Constipation, unspecified: Secondary | ICD-10-CM

## 2014-04-16 DIAGNOSIS — F39 Unspecified mood [affective] disorder: Secondary | ICD-10-CM

## 2014-04-16 DIAGNOSIS — F0280 Dementia in other diseases classified elsewhere without behavioral disturbance: Secondary | ICD-10-CM

## 2014-04-16 DIAGNOSIS — G309 Alzheimer's disease, unspecified: Secondary | ICD-10-CM

## 2014-04-16 NOTE — Progress Notes (Signed)
Patient ID: [name], female   DOB: [birth year], 80 y.o.   MRN: [id number]  Location:  [facility] Provider:  Rexene Edison. Mariea Clonts, D.O., C.M.D.  Code Status:  DNR  Chief Complaint  Patient presents with  . Medical Management of Chronic Issues    HPI:  80 yo female long term care resident with dementia with behaviors, chronic constipation, hypertension seen for medical mgt of chronic diseases.  She is c/o nausea today and abdominal pain.  She would not allow me to examine her--she grabbed by stethoscope with her hand and pulled on it even after encouragement that I was checking her b/c of her belly pain.  She had spit out her medications at lunchtime.    Review of Systems:  Review of Systems  Constitutional: Negative for fever and chills.  HENT: Negative for congestion.   Respiratory: Negative for shortness of breath.   Cardiovascular: Negative for chest pain.  Gastrointestinal: Positive for nausea and abdominal pain. Negative for vomiting, diarrhea, constipation, blood in stool and melena.  Genitourinary: Negative for dysuria.  Musculoskeletal: Negative for falls and myalgias.  Skin: Negative for rash.  Neurological: Negative for loss of consciousness.  Endo/Heme/Allergies: Bruises/bleeds easily.  Psychiatric/Behavioral: Positive for memory loss.    Medications: Patient's Medications  New Prescriptions   No medications on file  Previous Medications   CALCIUM CARB-CHOLECALCIFEROL (CALCIUM + D3) 600-200 MG-UNIT TABS    Take 1 tablet by mouth daily. Take 1 tablet by mouth daily for calcium deficiency.   CHOLECALCIFEROL (VITAMIN D) 1000 UNITS TABLET    Take 1,000 Units by mouth daily.   DIVALPROEX (DEPAKOTE) 125 MG DR TABLET    Take 125 mg by mouth every morning.   DIVALPROEX (DEPAKOTE) 250 MG DR TABLET    Take 250 mg by mouth at bedtime.   DOCUSATE SODIUM (COLACE) 100 MG CAPSULE    Take 100 mg by mouth 2 (two) times daily. Take 1 tablet by mouth twice daily for  constipation.   HYDROCODONE-ACETAMINOPHEN (NORCO/VICODIN) 5-325 MG PER TABLET    Take one tablet by mouth every 6 hours as needed for pain   LIDOCAINE (LIDODERM) 5 %    Place 1 patch onto the skin daily. Apply to lower back topically one time daily for back pain,place on in the am and off in the pm.   LORAZEPAM (ATIVAN) 0.5 MG TABLET    Take one tablet by mouth every 8 hours as needed for anxiety   METOPROLOL TARTRATE (LOPRESSOR) 25 MG TABLET    Take 25 mg by mouth 2 (two) times daily. Give 25 mg tablet  by mouth twice daily for hypertension.   POLYETHYLENE GLYCOL (MIRALAX / GLYCOLAX) PACKET    Take 17 g by mouth daily. Mix 17 gms with 8 oz of liquid of choice for constipation.   SERTRALINE (ZOLOFT) 50 MG TABLET    Take 50 mg by mouth daily.  Modified Medications   No medications on file  Discontinued Medications   No medications on file    Physical Exam: There were no vitals filed for this visit. Physical Exam  Constitutional:  Appears uncomfortable today, covers herself up with her blankets and closes her eyes--refused exam (see hpi)     Labs reviewed: 03/25/14:  Alb 2.9, tb 0.3, db <0.2, alk phos 48, ALT 5, AST 14, depakote 28  Assessment/Plan 1. Nausea alone -monitor po intake--encourage fluids alone  2. Alzheimer's disease -cont depakote to stabilize mood  3. Chronic constipation -cont miralax  4. Senile osteoporosis -cont ca with D and vit D  5. Episodic mood disorder -cont depakote for this   Family/ staff Communication: discussed with her nurse  Goals of care: long term care resident--comfort measures  Labs/tests ordered:  None today

## 2014-05-07 ENCOUNTER — Encounter: Payer: Self-pay | Admitting: Internal Medicine

## 2014-05-07 ENCOUNTER — Non-Acute Institutional Stay (SKILLED_NURSING_FACILITY): Payer: Medicare Other | Admitting: Internal Medicine

## 2014-05-07 DIAGNOSIS — N6310 Unspecified lump in the right breast, unspecified quadrant: Secondary | ICD-10-CM

## 2014-05-07 DIAGNOSIS — N63 Unspecified lump in unspecified breast: Secondary | ICD-10-CM

## 2014-05-07 DIAGNOSIS — R634 Abnormal weight loss: Secondary | ICD-10-CM

## 2014-05-07 DIAGNOSIS — F0280 Dementia in other diseases classified elsewhere without behavioral disturbance: Secondary | ICD-10-CM

## 2014-05-07 DIAGNOSIS — G309 Alzheimer's disease, unspecified: Secondary | ICD-10-CM

## 2014-05-07 NOTE — Progress Notes (Signed)
Patient ID: [name], female   DOB: [birth year], 80 y.o.   MRN: [id number]    Location:  [facility] Provider:  Rexene Edison. Mariea Clonts, D.O., C.M.D.  Code Status:  DNR  Chief Complaint  Patient presents with  . Acute Visit    right breast lump with inverted nipple    HPI:  80 yo female with severe dementia, dependent in ADLs except feeding was seen for acute visit due to nursing staff noticing a breast lump and inversion of the nipple of her right breast.  She has been losing weight (cannot tell this in epic flowsheet b/c most weights not recorded), and this has been attributed to failure to thrive from her dementia, but she has an amazing appetite most of the time.  She admits to pain in her right breast.    Review of Systems:  Review of Systems  Constitutional: Negative for fever.  HENT: Negative for congestion.   Respiratory: Negative for shortness of breath.   Cardiovascular: Negative for chest pain.       Pain right breast  Gastrointestinal: Negative for abdominal pain.  Genitourinary: Negative for dysuria, urgency and frequency.  Musculoskeletal: Negative for myalgias.  Skin: Negative for rash.  Neurological: Negative for loss of consciousness.  Psychiatric/Behavioral: Positive for memory loss.    Medications: Patient's Medications  New Prescriptions   No medications on file  Previous Medications   CALCIUM CARB-CHOLECALCIFEROL (CALCIUM + D3) 600-200 MG-UNIT TABS    Take 1 tablet by mouth daily. Take 1 tablet by mouth daily for calcium deficiency.   CHOLECALCIFEROL (VITAMIN D) 1000 UNITS TABLET    Take 1,000 Units by mouth daily.   DIVALPROEX (DEPAKOTE) 125 MG DR TABLET    Take 125 mg by mouth every morning.   DIVALPROEX (DEPAKOTE) 250 MG DR TABLET    Take 250 mg by mouth at bedtime.   DOCUSATE SODIUM (COLACE) 100 MG CAPSULE    Take 100 mg by mouth 2 (two) times daily. Take 1 tablet by mouth twice daily for constipation.   HYDROCODONE-ACETAMINOPHEN  (NORCO/VICODIN) 5-325 MG PER TABLET    Take one tablet by mouth every 6 hours as needed for pain   LIDOCAINE (LIDODERM) 5 %    Place 1 patch onto the skin daily. Apply to lower back topically one time daily for back pain,place on in the am and off in the pm.   LORAZEPAM (ATIVAN) 0.5 MG TABLET    Take one tablet by mouth every 8 hours as needed for anxiety   METOPROLOL TARTRATE (LOPRESSOR) 25 MG TABLET    Take 25 mg by mouth 2 (two) times daily. Give 25 mg tablet  by mouth twice daily for hypertension.   POLYETHYLENE GLYCOL (MIRALAX / GLYCOLAX) PACKET    Take 17 g by mouth daily. Mix 17 gms with 8 oz of liquid of choice for constipation.   SERTRALINE (ZOLOFT) 50 MG TABLET    Take 50 mg by mouth daily.  Modified Medications   No medications on file  Discontinued Medications   No medications on file    Physical Exam: Filed Vitals:   05/07/14 1746  BP: 128/76  Pulse: 70  Temp: 96.7 F (35.9 C)  Resp: 18  Height: 5\' 3"  (1.6 m)  Weight: 134 lb (60.782 kg)  SpO2: 97%   Physical Exam  Constitutional:  Frail female, nad resting with hob elevated  HENT:  Head: Normocephalic and atraumatic.  Cardiovascular: Normal rate, regular rhythm, normal heart sounds and  intact distal pulses.   Pulmonary/Chest: Effort normal and breath sounds normal. No respiratory distress. Right breast exhibits inverted nipple, mass and tenderness. Right breast exhibits no nipple discharge and no skin change. Left breast exhibits no inverted nipple, no mass, no nipple discharge, no skin change and no tenderness. Breasts are asymmetrical.  Abdominal: Soft. Bowel sounds are normal. She exhibits no distension and no mass. There is no tenderness.  Musculoskeletal:  Unable to elevate right arm to assess axillary adenopathy today due to pt's position in bed  Neurological: She is alert.  Skin: Skin is warm and dry.  Psychiatric:  Michela Pitcher she didn't feel well, was pleasant and compliant with exam today    Assessment/Plan 1.  Breast mass, right -will check ultrasound right breast as pt is unable to stand long enough for diagnostic mammogram -will need to discuss with family if this appears to be malignant   2. Loss of weight -I was under the impression she was losing weight but eating a lot, but epic flowsheet does not indicate weight loss (many missing weights)  3. Alzheimer's disease -advanced stages, does still eat for herself and participate some in activities, but otherwise requires assistance with self-care  Family/ staff Communication: discussed with CNA and nurse  Goals of care: DNR, comfort measures  Labs/tests ordered:  Ultrasound of right breast at the breast center

## 2014-05-12 ENCOUNTER — Other Ambulatory Visit: Payer: Self-pay | Admitting: Internal Medicine

## 2014-05-12 ENCOUNTER — Other Ambulatory Visit (HOSPITAL_COMMUNITY): Payer: Self-pay | Admitting: Radiology

## 2014-05-12 DIAGNOSIS — N6459 Other signs and symptoms in breast: Secondary | ICD-10-CM

## 2014-05-12 DIAGNOSIS — N6310 Unspecified lump in the right breast, unspecified quadrant: Secondary | ICD-10-CM

## 2014-05-20 ENCOUNTER — Ambulatory Visit
Admission: RE | Admit: 2014-05-20 | Discharge: 2014-05-20 | Disposition: A | Discharge status: Home / Self Care | Payer: Medicare Other | Source: Ambulatory Visit | Attending: Internal Medicine | Admitting: Internal Medicine

## 2014-05-20 ENCOUNTER — Encounter (INDEPENDENT_AMBULATORY_CARE_PROVIDER_SITE_OTHER): Payer: Self-pay

## 2014-05-20 ENCOUNTER — Other Ambulatory Visit: Payer: Self-pay | Admitting: Internal Medicine

## 2014-05-20 DIAGNOSIS — N6459 Other signs and symptoms in breast: Secondary | ICD-10-CM

## 2014-05-20 DIAGNOSIS — N6310 Unspecified lump in the right breast, unspecified quadrant: Secondary | ICD-10-CM

## 2014-05-26 ENCOUNTER — Encounter: Payer: Self-pay | Admitting: Internal Medicine

## 2014-07-02 ENCOUNTER — Non-Acute Institutional Stay (SKILLED_NURSING_FACILITY): Payer: Medicare Other | Admitting: Internal Medicine

## 2014-07-02 ENCOUNTER — Encounter: Payer: Self-pay | Admitting: Internal Medicine

## 2014-07-02 DIAGNOSIS — G309 Alzheimer's disease, unspecified: Secondary | ICD-10-CM

## 2014-07-02 DIAGNOSIS — F6589 Other paraphilias: Secondary | ICD-10-CM

## 2014-07-02 DIAGNOSIS — K5900 Constipation, unspecified: Secondary | ICD-10-CM

## 2014-07-02 DIAGNOSIS — N6310 Unspecified lump in the right breast, unspecified quadrant: Secondary | ICD-10-CM

## 2014-07-02 DIAGNOSIS — R634 Abnormal weight loss: Secondary | ICD-10-CM

## 2014-07-02 DIAGNOSIS — F528 Other sexual dysfunction not due to a substance or known physiological condition: Secondary | ICD-10-CM

## 2014-07-02 DIAGNOSIS — K5909 Other constipation: Secondary | ICD-10-CM

## 2014-07-02 DIAGNOSIS — N63 Unspecified lump in unspecified breast: Secondary | ICD-10-CM

## 2014-07-02 DIAGNOSIS — F0280 Dementia in other diseases classified elsewhere without behavioral disturbance: Secondary | ICD-10-CM

## 2014-07-02 NOTE — Progress Notes (Signed)
Patient ID: [name], female   DOB: 1918-11-28, 80 y.o.   MRN: [id number]  Location:  Location:  [facility] Provider:  Rexene Edison. Mariea Clonts, D.O., C.M.D.  Code Status:  DNR  Chief Complaint  Patient presents with  . Medical Management of Chronic Issues    HPI:  80 yo female with end stage AD and breast mass on right with weight loss was seen for med mgt of her chronic diseases.  She underwent a diagnostic mammo 6/30 and [place] of right breast When results were reviewed with her son, he opted for a palliative approach to her care, and chose not to proceed with a stereotactic biopsy to limit her suffering at this point.  She is gradually getting more confused and having more pain in the breast and her abdomen.  Review of Systems:  Review of Systems  Constitutional: Positive for weight loss.  Respiratory: Negative for shortness of breath.   Cardiovascular: Positive for chest pain.  Gastrointestinal: Positive for abdominal pain.  Genitourinary: Negative for dysuria.  Musculoskeletal: Negative for falls.  Neurological: Positive for weakness. Negative for dizziness.  Psychiatric/Behavioral: Positive for memory loss.    Medications: Patient's Medications  New Prescriptions   No medications on file  Previous Medications   CALCIUM CARB-CHOLECALCIFEROL (CALCIUM + D3) 600-200 MG-UNIT TABS    Take 1 tablet by mouth daily. Take 1 tablet by mouth daily for calcium deficiency.   CHOLECALCIFEROL (VITAMIN D) 1000 UNITS TABLET    Take 1,000 Units by mouth daily.   DIVALPROEX (DEPAKOTE) 125 MG DR TABLET    Give 2 tablets by mouth two times daily.   DIVALPROEX (DEPAKOTE) 250 MG DR TABLET    Take 250 mg by mouth at bedtime.   DOCUSATE SODIUM (COLACE) 100 MG CAPSULE    Take 100 mg by mouth 2 (two) times daily. Take 1 tablet by mouth twice daily for constipation.   HYDROCODONE-ACETAMINOPHEN (NORCO/VICODIN) 5-325 MG PER TABLET    Take one tablet by mouth every 6 hours as needed for pain     LIDOCAINE (LIDODERM) 5 %    Place 1 patch onto the skin daily. Apply to lower back topically one time daily for back pain,place on in the am and off in the pm.   LORAZEPAM (ATIVAN) 0.5 MG TABLET    Take one tablet by mouth every 8 hours as needed for anxiety   METOPROLOL TARTRATE (LOPRESSOR) 25 MG TABLET    Take 25 mg by mouth 2 (two) times daily. Give 25 mg tablet  by mouth twice daily for hypertension.   POLYETHYLENE GLYCOL (MIRALAX / GLYCOLAX) PACKET    Take 17 g by mouth daily. Mix 17 gms with 8 oz of liquid of choice for constipation.   SERTRALINE (ZOLOFT) 50 MG TABLET    Take 50 mg by mouth daily.  Modified Medications   No medications on file  Discontinued Medications   No medications on file    Physical Exam: Filed Vitals:   07/02/14 1454  BP: 122/69  Pulse: 69  Temp: 97.8 F (36.6 C)  Resp: 18  Height: 5\' 3"  (1.6 m)  Weight: 134 lb (60.782 kg)  SpO2: 95%  Physical Exam  Cardiovascular: Normal rate, regular rhythm, normal heart sounds and intact distal pulses.   Pulmonary/Chest: Effort normal and breath sounds normal. No respiratory distress.  Hard right breast mass present extending into axilla  Abdominal: Soft. Bowel sounds are normal. She exhibits no mass. There is tenderness.  Neurological: She  is alert.  More confused than her baseline  Skin: Skin is warm and dry.     Labs reviewed: Basic Metabolic Panel:  Recent Labs  02/13/14  NA 140  K 4.3  BUN 13    Liver Function Tests:  Recent Labs  03/25/14  AST 14  ALT 5*  ALKPHOS 48    CBC: No results found for this basename: WBC, NEUTROABS, HGB, HCT, MCV, PLT,  in the last 8760 hours  Significant Diagnostic Results:  05/20/14:  MM diagnostic UL Right:  1. 5.0 cm right breast mass with imaging features highly suspicious for malignancy. 2. Borderline abnormal right axillary lymph node, possibly representing a metastatic node. 3. Inability to image the left breast due to patient  noncooperation. RECOMMENDATION: Right breast and right axillary lymph node ultrasound-guided core needle biopsy with patient sedation if clinically indicated.  05/20/14:  US breast ltd uni right inc axilla:  1. 5.0 cm right breast mass with imaging features highly suspicious for malignancy. 2. Borderline abnormal right axillary lymph node, possibly representing a metastatic node. 3. Inability to image the left breast due to patient noncooperation. RECOMMENDATION: Right breast and right axillary lymph node ultrasound-guided core needle biopsy with patient sedation if clinically indicated.   Assessment/Plan 1. Breast mass, right -appears clearly malignant based on mammo and US findings, associated pain and weight loss and cognitive decline -her son has opted to palliative care measures at this time -she has been started on hydrocodone for pain and a bowel regimen   2. Loss of weight -due to suspected breast cancer  3. Alzheimer's disease -is end stage and recently much worse since breast mass has been progressing  4. Chronic constipation -cont miralax and colace especially with hydrocodone now for pain  5. 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

## 2014-08-05 ENCOUNTER — Non-Acute Institutional Stay (SKILLED_NURSING_FACILITY): Payer: Medicare Other | Admitting: Internal Medicine

## 2014-08-05 DIAGNOSIS — C50911 Malignant neoplasm of unspecified site of right female breast: Secondary | ICD-10-CM

## 2014-08-05 DIAGNOSIS — C773 Secondary and unspecified malignant neoplasm of axilla and upper limb lymph nodes: Secondary | ICD-10-CM

## 2014-08-05 DIAGNOSIS — C50919 Malignant neoplasm of unspecified site of unspecified female breast: Secondary | ICD-10-CM

## 2014-08-05 DIAGNOSIS — Z7189 Other specified counseling: Secondary | ICD-10-CM

## 2014-08-05 NOTE — Progress Notes (Signed)
MRN: [id number] Name: [name]  Sex: female Age: 80 y.o. DOB: 1919-10-23  [place] #: Karren Burly Facility/Room: 111B Level Of Care: SNF Provider: Inocencio Homes D Emergency Contacts: Extended Emergency Contact Information Primary Emergency Contact: Ferguson,Robert Address: [address] [place], [place] [address] [place] Phone: [phone] Mobile Phone: [phone] Relation: Son  Code Status: DNR  Allergies: Sulfa antibiotics  Chief Complaint  Patient presents with  . family conference with patient present    HPI: Patient is 80 y.o. female who has a growing painful mass in R breast and whose family is here to discuss the situation  Past Medical History  Diagnosis Date  . Anxiety   . Anemia   . Hypertension   . Arthritis   . Osteoporosis   . Weight loss   . Alzheimer's disease   . Reflux esophagitis   . Diverticulosis of colon (without mention of hemorrhage)   . Unspecified constipation   . Hemorrhage of gastrointestinal tract, unspecified   . Lumbago     Past Surgical History  Procedure Laterality Date  . Left interochanteric orif  01/01/2011  . Attempted sigmoidoscopy  12/22/2010      Medication List       This list is accurate as of: 08/05/14  8:27 PM.  Always use your most recent med list.               Calcium + D3 600-200 MG-UNIT Tabs  Take 1 tablet by mouth daily. Take 1 tablet by mouth daily for calcium deficiency.     cholecalciferol 1000 UNITS tablet  Commonly known as:  VITAMIN D  Take 1,000 Units by mouth daily.     divalproex 250 MG DR tablet  Commonly known as:  DEPAKOTE  Take 250 mg by mouth at bedtime.     divalproex 125 MG DR tablet  Commonly known as:  DEPAKOTE  Give 2 tablets by mouth two times daily.     docusate sodium 100 MG capsule  Commonly known as:  COLACE  Take 100 mg by mouth 2 (two) times daily. Take 1 tablet by mouth twice daily for constipation.     HYDROcodone-acetaminophen 5-325 MG per tablet  Commonly known as:  NORCO/VICODIN  Take one tablet by mouth every 6 hours as needed for pain     LIDODERM 5 %  Generic drug:  lidocaine  Place 1 patch onto the skin daily. Apply to lower back topically one time daily for back pain,place on in the am and off in the pm.     LORazepam 0.5 MG tablet  Commonly known as:  ATIVAN  Take one tablet by mouth every 8 hours as needed for anxiety     metoprolol tartrate 25 MG tablet  Commonly known as:  LOPRESSOR  Take 25 mg by mouth 2 (two) times daily. Give 25 mg tablet  by mouth twice daily for hypertension.     polyethylene glycol packet  Commonly known as:  MIRALAX / GLYCOLAX  Take 17 g by mouth daily. Mix 17 gms with 8 oz of liquid of choice for constipation.     sertraline 50 MG tablet  Commonly known as:  ZOLOFT  Take 50 mg by mouth daily.        No orders of the defined types were placed in this encounter.    Immunization History  Administered Date(s) Administered  . Influenza Whole 08/29/2013  . PPD Test  01/07/2011  . Pneumococcal-Unspecified 12/19/2011    History  Substance Use Topics  . Smoking status: Never Smoker   . Smokeless tobacco: Not on file  . Alcohol Use: No    Review of Systems  DATA OBTAINED: from patient, nurse - pain in R breast;some decrease in attitude but pt still likes sweets  Filed Vitals:   08/05/14 2019  BP: 126/70  Pulse: 67  Temp: 97.5 F (36.4 C)  Resp: 18    Physical Exam  GENERAL APPEARANCE: Alert, modconversant. Appropriately groomed. No acute distress  SKIN: No diaphoresis rash HEENT: Unremarkable BREAST: inverted nipple, dependant bruising medial lower breast with a mass, maybe 6X6 cm R mid breast, unable to palpate very accurately 2/2 pain RESPIRATORY: Breathing is even, unlabored. Lung sounds are clear   CARDIOVASCULAR: Heart RRR no murmurs, rubs or gallops. No peripheral edema  GASTROINTESTINAL: Abdomen is soft, non-tender,  not distended w/ normal bowel sounds.  GENITOURINARY: Bladder non tender, not distended  MUSCULOSKELETAL: No abnormal joints or musculature NEUROLOGIC: Cranial nerves 2-12 grossly intact. Moves all extremities no tremor. PSYCHIATRIC: dementia, no behavioral issues  Patient Active Problem List   Diagnosis Date Noted  . Breast cancer metastasized to axillary lymph node 08/05/2014  . Senile osteoporosis 12/06/2013  . Chronic constipation 12/06/2013  . Osteoporosis   . Anxiety state, unspecified 02/22/2013  . Alzheimer's disease 02/22/2013  . Essential hypertension, benign 02/22/2013  . GERD (gastroesophageal reflux disease) 02/22/2013  . Unspecified constipation 02/22/2013  . Low back pain 02/22/2013    CBC    Component Value Date/Time   WBC 5.5 05/21/2011 1306   RBC 4.01 05/21/2011 1306   HGB 12.0 05/21/2011 1306   HCT 34.7* 05/21/2011 1306   PLT 209 05/21/2011 1306   MCV 86.5 05/21/2011 1306   LYMPHSABS 1.4 05/21/2011 1306   MONOABS 0.3 05/21/2011 1306   EOSABS 0.0 05/21/2011 1306   BASOSABS 0.0 05/21/2011 1306    CMP     Component Value Date/Time   NA 140 02/13/2014   NA 140 05/21/2011 1306   K 4.3 02/13/2014   CL 104 05/21/2011 1306   CO2 28 05/21/2011 1306   GLUCOSE 81 05/21/2011 1306   BUN 13 02/13/2014   BUN 11 05/21/2011 1306   CREATININE 0.64 05/21/2011 1306   CALCIUM 9.2 05/21/2011 1306   PROT 7.4 04/28/2011 1719   ALBUMIN 3.1* 04/28/2011 1719   AST 14 03/25/2014   ALT 5* 03/25/2014   ALKPHOS 48 03/25/2014   BILITOT 0.5 04/28/2011 1719   GFRNONAA >60 05/21/2011 1306   GFRAA >60 05/21/2011 1306    Assessment and Plan  FAMILY CONFERENCE WITH PATIENT PRESENT- met with DON, nurse and 2 sons, one of who is POA. There are 2 daughters. We discussed pt's case at length and both sons were in agreement about comfort care only which I agree with. Pt could never withstand even a work-up much less the treatment. Pt did not know her sons but knew they were friends. When we were leaving she asked  me "am I going now?" with no fear, just curiosity, and I assured her " not now". She was content but did ask for some cake or ice cream.  Breast cancer metastasized to axillary lymph node This is a clinical dx from a mammogram performed 05/14/14 in which a roughly 4X4X4 cm mass was seen in R breast with swelling R axilla suspicious for a node with dx of highly suspicious for CA. At that time pt's son and POA  did not wish for a work up to be persued. Pt has in last week or so nursing noted bruising and a lump in R breast and c/o pain. Roxanol has been written for pt for pain.   Time spent > 35 min  Hennie Duos, MD

## 2014-08-05 NOTE — Assessment & Plan Note (Signed)
This is a clinical dx from a mammogram performed 05/14/14 in which a roughly 4X4X4 cm mass was seen in R breast with swelling R axilla suspicious for a node with dx of highly suspicious for CA. At that time pt's son and POA did not wish for a work up to be persued. Pt has in last week or so nursing noted bruising and a lump in R breast and c/o pain. Roxanol has been written for pt for pain.

## 2014-08-06 ENCOUNTER — Encounter: Payer: Self-pay | Admitting: Internal Medicine

## 2014-08-13 ENCOUNTER — Non-Acute Institutional Stay (SKILLED_NURSING_FACILITY): Payer: Medicare Other | Admitting: Internal Medicine

## 2014-08-13 DIAGNOSIS — R400 Somnolence: Secondary | ICD-10-CM

## 2014-08-13 DIAGNOSIS — C50911 Malignant neoplasm of unspecified site of right female breast: Secondary | ICD-10-CM

## 2014-08-13 DIAGNOSIS — C50919 Malignant neoplasm of unspecified site of unspecified female breast: Secondary | ICD-10-CM

## 2014-08-13 DIAGNOSIS — C773 Secondary and unspecified malignant neoplasm of axilla and upper limb lymph nodes: Secondary | ICD-10-CM

## 2014-08-13 DIAGNOSIS — Z710 Person encountering health services to consult on behalf of another person: Secondary | ICD-10-CM

## 2014-08-13 DIAGNOSIS — R404 Transient alteration of awareness: Secondary | ICD-10-CM

## 2014-08-13 NOTE — Assessment & Plan Note (Addendum)
Pt has developed a large swelling R jaw/neck. She is comfort care only so there will be no work-up. I do not think this is a lymph node. I don't really know what it is, it is more on face than neck, perhaps involving parotid gland. No signs of infection

## 2014-08-13 NOTE — Progress Notes (Signed)
MRN: [id number] Name: [name]  Sex: female Age: 80 y.o. DOB: Jun 08, 1919  [place] #:  Facility/Room: Level Of Care: SNF Provider: Inocencio Homes D Emergency Contacts: Extended Emergency Contact Information Primary Emergency Contact: Ferguson,Robert Address: [address] [place], [place] [address] [place] Phone: [phone] Mobile Phone: [phone] Relation: Son  Code Status:   Allergies: Sulfa antibiotics  Chief Complaint  Patient presents with  . Medical Management of Chronic Issues    HPI: Patient is 80 y.o. female who nurses have asked me to see for new swelling on face. Also spoke with daughter at length.  Past Medical History  Diagnosis Date  . Anxiety   . Anemia   . Hypertension   . Arthritis   . Osteoporosis   . Weight loss   . Alzheimer's disease   . Reflux esophagitis   . Diverticulosis of colon (without mention of hemorrhage)   . Unspecified constipation   . Hemorrhage of gastrointestinal tract, unspecified   . Lumbago     Past Surgical History  Procedure Laterality Date  . Left interochanteric orif  01/01/2011  . Attempted sigmoidoscopy  12/22/2010      Medication List       This list is accurate as of: 08/13/14 11:59 PM.  Always use your most recent med list.               divalproex 250 MG DR tablet  Commonly known as:  DEPAKOTE  Take 250 mg by mouth at bedtime.     divalproex 125 MG DR tablet  Commonly known as:  DEPAKOTE  Give 2 tablets by mouth two times daily.     docusate sodium 100 MG capsule  Commonly known as:  COLACE  Take 100 mg by mouth 2 (two) times daily. Take 1 tablet by mouth twice daily for constipation.     LIDODERM 5 %  Generic drug:  lidocaine  Place 1 patch onto the skin daily. Apply to lower back topically one time daily for back pain,place on in the am and off in the pm.     LORazepam 0.5 MG tablet  Commonly known as:  ATIVAN  Take one tablet by mouth every 8 hours  as needed for anxiety     metoprolol tartrate 25 MG tablet  Commonly known as:  LOPRESSOR  Take 25 mg by mouth 2 (two) times daily. Give 25 mg tablet  by mouth twice daily for hypertension.     polyethylene glycol packet  Commonly known as:  MIRALAX / GLYCOLAX  Take 17 g by mouth daily. Mix 17 gms with 8 oz of liquid of choice for constipation.     sertraline 50 MG tablet  Commonly known as:  ZOLOFT  Take 50 mg by mouth daily.        No orders of the defined types were placed in this encounter.    Immunization History  Administered Date(s) Administered  . Influenza Whole 08/29/2013  . PPD Test 01/07/2011  . Pneumococcal-Unspecified 12/19/2011    History  Substance Use Topics  . Smoking status: Never Smoker   . Smokeless tobacco: Not on file  . Alcohol Use: No    Review of Systems   UTO - nurses report pt is sleeping all the time  l.   Filed Vitals:   08/13/14 1035  BP: 116/70  Pulse: 64  Temp: 97.8 F (36.6 C)  Resp: 18  Physical Exam  GENERAL APPEARANCE: sleeping SKIN: No diaphoresis rash HEENT: large swelling on R face over parotid gland extending onto R neck;fluctuant, moveable, not red or hot; pt grimmances in her sleep with my palpation of it RESPIRATORY: Breathing is even, unlabored. Lung sounds are clear   CARDIOVASCULAR: Heart RRR no murmurs, rubs or gallops. No peripheral edema  GASTROINTESTINAL: Abdomen is soft, non-tender, not distended w/ normal bowel sounds.  GENITOURINARY: Bladder non tender, not distended  MUSCULOSKELETAL: No abnormal joints or musculature NEUROLOGIC: decreased LOC   Patient Active Problem List   Diagnosis Date Noted  . Breast cancer metastasized to axillary lymph node 08/05/2014  . Senile osteoporosis 12/06/2013  . Chronic constipation 12/06/2013  . Osteoporosis   . Anxiety state, unspecified 02/22/2013  . Alzheimer's disease 02/22/2013  . Essential hypertension, benign 02/22/2013  . GERD (gastroesophageal reflux  disease) 02/22/2013  . Unspecified constipation 02/22/2013  . Low back pain 02/22/2013    CBC    Component Value Date/Time   WBC 5.5 05/21/2011 1306   RBC 4.01 05/21/2011 1306   HGB 12.0 05/21/2011 1306   HCT 34.7* 05/21/2011 1306   PLT 209 05/21/2011 1306   MCV 86.5 05/21/2011 1306   LYMPHSABS 1.4 05/21/2011 1306   MONOABS 0.3 05/21/2011 1306   EOSABS 0.0 05/21/2011 1306   BASOSABS 0.0 05/21/2011 1306    CMP     Component Value Date/Time   NA 140 02/13/2014   NA 140 05/21/2011 1306   K 4.3 02/13/2014   CL 104 05/21/2011 1306   CO2 28 05/21/2011 1306   GLUCOSE 81 05/21/2011 1306   BUN 13 02/13/2014   BUN 11 05/21/2011 1306   CREATININE 0.64 05/21/2011 1306   CALCIUM 9.2 05/21/2011 1306   PROT 7.4 04/28/2011 1719   ALBUMIN 3.1* 04/28/2011 1719   AST 14 03/25/2014   ALT 5* 03/25/2014   ALKPHOS 48 03/25/2014   BILITOT 0.5 04/28/2011 1719   GFRNONAA >60 05/21/2011 1306   GFRAA >60 05/21/2011 1306    Assessment and Plan  Breast cancer metastasized to axillary lymph node Pt has developed a large swelling R jaw/neck. She is comfort care only so there will be no work-up. I do not think this is a lymph node. I don't really know what it is, it is more on face than neck, perhaps involving parotid gland. No signs of infection  SOMNOLENCE - will decrease morphine from 5 mg q6 to 2.5 mg q 6 scheduled with 2.5 mg q 6 prn to see if that is the problem vs decline  FAMILY CONFERENCE WITHOUT PT PRESENT- spoke with pt's daughter at length; there are family dynamics involved with her but I did discusss fully decisions and why they were made;her brother has POA  Date pt seen-08/12/2014 Hennie Duos, MD

## 2014-08-16 ENCOUNTER — Encounter: Payer: Self-pay | Admitting: Internal Medicine

## 2014-08-21 DEATH — deceased
